# Patient Record
Sex: Male | Born: 1935 | Race: Asian | Hispanic: No | Smoking: Former smoker
Health system: Southern US, Community
[De-identification: ages and names within clinical notes are randomized; demographics above are authoritative.]

## PROBLEM LIST (undated history)

## (undated) DIAGNOSIS — I10 Essential (primary) hypertension: Secondary | ICD-10-CM

## (undated) DIAGNOSIS — K219 Gastro-esophageal reflux disease without esophagitis: Secondary | ICD-10-CM

---

## 2005-05-24 ENCOUNTER — Emergency Department: Payer: Self-pay | Admitting: Internal Medicine

## 2005-05-24 ENCOUNTER — Other Ambulatory Visit: Payer: Self-pay

## 2007-08-23 ENCOUNTER — Ambulatory Visit: Payer: Self-pay | Admitting: Family Medicine

## 2009-01-21 ENCOUNTER — Emergency Department: Payer: Self-pay | Admitting: Emergency Medicine

## 2012-01-15 ENCOUNTER — Emergency Department: Payer: Self-pay | Admitting: Emergency Medicine

## 2012-01-15 LAB — URINALYSIS, COMPLETE
Bacteria: NONE SEEN
Bilirubin,UR: NEGATIVE
Blood: NEGATIVE
Glucose,UR: NEGATIVE mg/dL (ref 0–75)
Ketone: NEGATIVE
Leukocyte Esterase: NEGATIVE
Ph: 7 (ref 4.5–8.0)
Protein: NEGATIVE
RBC,UR: 1 /HPF (ref 0–5)
Squamous Epithelial: <1

## 2012-01-15 LAB — COMPREHENSIVE METABOLIC PANEL
Anion Gap: 11 (ref 7–16)
BUN: 12 mg/dL (ref 7–18)
Bilirubin,Total: 0.5 mg/dL (ref 0.2–1.0)
Calcium, Total: 8.5 mg/dL (ref 8.5–10.1)
Chloride: 100 mmol/L (ref 98–107)
Co2: 23 mmol/L (ref 21–32)
Creatinine: 1.01 mg/dL (ref 0.60–1.30)
EGFR (African American): >60
EGFR (Non-African Amer.): >60
Osmolality: 269 (ref 275–301)
SGPT (ALT): 23 U/L (ref 12–78)
Sodium: 134 mmol/L — ABNORMAL LOW (ref 136–145)

## 2012-01-15 LAB — CBC
HCT: 41.2 % (ref 40.0–52.0)
HGB: 13.4 g/dL (ref 13.0–18.0)
MCV: 84 fL (ref 80–100)
Platelet: 170 10*3/uL (ref 150–440)
RBC: 4.91 10*6/uL (ref 4.40–5.90)
RDW: 13.1 % (ref 11.5–14.5)
WBC: 7.9 10*3/uL (ref 3.8–10.6)

## 2012-01-15 LAB — TROPONIN I: Troponin-I: <0.02 ng/mL

## 2012-01-15 LAB — CK TOTAL AND CKMB (NOT AT ARMC): CK-MB: 1.5 ng/mL (ref 0.5–3.6)

## 2012-02-11 ENCOUNTER — Encounter: Payer: Self-pay | Admitting: Unknown Physician Specialty

## 2012-02-26 ENCOUNTER — Encounter: Payer: Self-pay | Admitting: Unknown Physician Specialty

## 2015-10-05 ENCOUNTER — Encounter: Payer: Self-pay | Admitting: Emergency Medicine

## 2015-10-05 ENCOUNTER — Emergency Department: Payer: Medicare Other

## 2015-10-05 ENCOUNTER — Emergency Department
Admission: EM | Admit: 2015-10-05 | Discharge: 2015-10-05 | Disposition: A | Discharge status: Home / Self Care | Payer: Medicare Other | Attending: Student | Admitting: Student

## 2015-10-05 DIAGNOSIS — Z79899 Other long term (current) drug therapy: Secondary | ICD-10-CM | POA: Diagnosis not present

## 2015-10-05 DIAGNOSIS — Z87891 Personal history of nicotine dependence: Secondary | ICD-10-CM | POA: Insufficient documentation

## 2015-10-05 DIAGNOSIS — I10 Essential (primary) hypertension: Secondary | ICD-10-CM | POA: Diagnosis not present

## 2015-10-05 DIAGNOSIS — K529 Noninfective gastroenteritis and colitis, unspecified: Secondary | ICD-10-CM | POA: Insufficient documentation

## 2015-10-05 DIAGNOSIS — R1032 Left lower quadrant pain: Secondary | ICD-10-CM | POA: Diagnosis present

## 2015-10-05 HISTORY — DX: Gastro-esophageal reflux disease without esophagitis: K21.9

## 2015-10-05 LAB — COMPREHENSIVE METABOLIC PANEL
ALT: 19 U/L (ref 17–63)
AST: 25 U/L (ref 15–41)
Albumin: 3.8 g/dL (ref 3.5–5.0)
Alkaline Phosphatase: 63 U/L (ref 38–126)
Anion gap: 8 (ref 5–15)
BUN: 34 mg/dL — ABNORMAL HIGH (ref 6–20)
CHLORIDE: 100 mmol/L — AB (ref 101–111)
CO2: 22 mmol/L (ref 22–32)
CREATININE: 1.17 mg/dL (ref 0.61–1.24)
Calcium: 8.7 mg/dL — ABNORMAL LOW (ref 8.9–10.3)
GFR calc non Af Amer: 49 mL/min — ABNORMAL LOW (ref >60)
GFR, EST AFRICAN AMERICAN: 57 mL/min — AB (ref >60)
Glucose, Bld: 127 mg/dL — ABNORMAL HIGH (ref 65–99)
Potassium: 4.4 mmol/L (ref 3.5–5.1)
SODIUM: 130 mmol/L — AB (ref 135–145)
Total Bilirubin: 1.6 mg/dL — ABNORMAL HIGH (ref 0.3–1.2)
Total Protein: 6.6 g/dL (ref 6.5–8.1)

## 2015-10-05 LAB — URINALYSIS COMPLETE WITH MICROSCOPIC (ARMC ONLY)
BACTERIA UA: NONE SEEN
Bilirubin Urine: NEGATIVE
Glucose, UA: NEGATIVE mg/dL
Hgb urine dipstick: NEGATIVE
Ketones, ur: NEGATIVE mg/dL
Leukocytes, UA: NEGATIVE
Nitrite: NEGATIVE
PROTEIN: NEGATIVE mg/dL
SQUAMOUS EPITHELIAL / LPF: NONE SEEN
Specific Gravity, Urine: 1.012 (ref 1.005–1.030)
pH: 7 (ref 5.0–8.0)

## 2015-10-05 LAB — CBC
HEMATOCRIT: 39.4 % — AB (ref 40.0–52.0)
HEMOGLOBIN: 12.4 g/dL — AB (ref 13.0–18.0)
MCH: 25.8 pg — AB (ref 26.0–34.0)
MCHC: 31.5 g/dL — ABNORMAL LOW (ref 32.0–36.0)
MCV: 81.8 fL (ref 80.0–100.0)
Platelets: 146 10*3/uL — ABNORMAL LOW (ref 150–440)
RBC: 4.81 MIL/uL (ref 4.40–5.90)
RDW: 13.5 % (ref 11.5–14.5)
WBC: 16.4 10*3/uL — ABNORMAL HIGH (ref 3.8–10.6)

## 2015-10-05 LAB — TROPONIN I: TROPONIN I: 0.03 ng/mL (ref <0.031)

## 2015-10-05 LAB — LIPASE, BLOOD: LIPASE: 17 U/L (ref 11–51)

## 2015-10-05 MED ORDER — ONDANSETRON HCL 4 MG/2ML IJ SOLN
4.0000 mg | Freq: Once | INTRAMUSCULAR | Status: AC
  Administered 2015-10-05: 4 mg via INTRAVENOUS

## 2015-10-05 MED ORDER — ONDANSETRON HCL 4 MG/2ML IJ SOLN
4.0000 mg | Freq: Once | INTRAMUSCULAR | Status: AC
  Administered 2015-10-05: 4 mg via INTRAVENOUS
  Filled 2015-10-05: qty 2

## 2015-10-05 MED ORDER — DIATRIZOATE MEGLUMINE & SODIUM 66-10 % PO SOLN
15.0000 mL | Freq: Once | ORAL | Status: AC
  Administered 2015-10-05: 15 mL via ORAL

## 2015-10-05 MED ORDER — ACETAMINOPHEN 325 MG PO TABS: 650.0000 mg | ORAL_TABLET | Freq: Four times a day (QID) | ORAL | Status: AC | PRN

## 2015-10-05 MED ORDER — AMOXICILLIN-POT CLAVULANATE 875-125 MG PO TABS: 1.0000 | ORAL_TABLET | Freq: Two times a day (BID) | ORAL | Status: AC

## 2015-10-05 MED ORDER — MORPHINE SULFATE (PF) 2 MG/ML IV SOLN
2.0000 mg | Freq: Once | INTRAVENOUS | Status: AC
  Administered 2015-10-05: 2 mg via INTRAVENOUS
  Filled 2015-10-05: qty 1

## 2015-10-05 MED ORDER — ONDANSETRON HCL 4 MG/2ML IJ SOLN
INTRAMUSCULAR | Status: AC
  Administered 2015-10-05: 4 mg via INTRAVENOUS
  Filled 2015-10-05: qty 2

## 2015-10-05 MED ORDER — METRONIDAZOLE 500 MG PO TABS: 500.0000 mg | ORAL_TABLET | Freq: Three times a day (TID) | ORAL | Status: AC

## 2015-10-05 MED ORDER — SODIUM CHLORIDE 0.9 % IV BOLUS (SEPSIS)
500.0000 mL | Freq: Once | INTRAVENOUS | Status: AC
  Administered 2015-10-05: 500 mL via INTRAVENOUS

## 2015-10-05 MED ORDER — IOPAMIDOL (ISOVUE-300) INJECTION 61%
75.0000 mL | Freq: Once | INTRAVENOUS | Status: AC | PRN
  Administered 2015-10-05: 75 mL via INTRAVENOUS

## 2015-10-05 NOTE — ED Provider Notes (Signed)
[facility] Emergency Department Provider Note   ____________________________________________  Time seen: Approximately 11:07 AM  I have reviewed the triage vital signs and the nursing notes.   HISTORY  Chief Complaint Abdominal Pain  Caveat- patient is from [place] and there is a language barrier however his son is at bedside and able to translate. Patient was offered use of language line interpreter however he declined.  HPI Michael Shelton is a 82 y.o. male with history of hypertension and GERD who presents for evaluation of left lower quadrant pain which began yesterday, gradual onset, constant, no modifying factors, currently moderate. No nausea, vomiting, diarrhea, fevers or chills, no chest pain or difficulty breathing. He has not had this pain before. Abdominal surgical history is significant for prior hernia operation, he has also had hemorrhoid repair.   Past Medical History  Diagnosis Date  . GERD (gastroesophageal reflux disease)     There are no active problems to display for this patient.   History reviewed. No pertinent past surgical history.  Current Outpatient Rx  Name  Route  Sig  Dispense  Refill  . amLODipine (NORVASC) 5 MG tablet   Oral   Take 5 mg by mouth daily.         . famotidine (PEPCID) 40 MG tablet   Oral   Take 40 mg by mouth daily.         . montelukast (SINGULAIR) 10 MG tablet   Oral   Take 10 mg by mouth daily.         Marland Kitchen acetaminophen (TYLENOL) 325 MG tablet   Oral   Take 2 tablets (650 mg total) by mouth every 6 (six) hours as needed for moderate pain.   100 tablet   2   . amoxicillin-clavulanate (AUGMENTIN) 875-125 MG tablet   Oral   Take 1 tablet by mouth 2 (two) times daily.   14 tablet   0   . metroNIDAZOLE (FLAGYL) 500 MG tablet   Oral   Take 1 tablet (500 mg total) by mouth 3 (three) times daily.   21 tablet   0     Allergies Review of patient's allergies indicates no known  allergies.  History reviewed. No pertinent family history.  Social History Social History  Substance Use Topics  . Smoking status: Former Games developer  . Smokeless tobacco: None  . Alcohol Use: No    Review of Systems Constitutional: No fever/chills Eyes: No visual changes. ENT: No sore throat. Cardiovascular: Denies chest pain. Respiratory: Denies shortness of breath. Gastrointestinal: + abdominal pain.  No nausea, no vomiting.  No diarrhea.  No constipation. Genitourinary: Negative for dysuria. Musculoskeletal: Negative for back pain. Skin: Negative for rash. Neurological: Negative for headaches, focal weakness or numbness.  10-point ROS otherwise negative.  ____________________________________________   PHYSICAL EXAM:  VITAL SIGNS: ED Triage Vitals  Enc Vitals Group     BP 10/05/15 1003 116/61 mmHg     Pulse Rate 10/05/15 1003 86     Resp 10/05/15 1003 20     Temp 10/05/15 1003 98 F (36.7 C)     Temp Source 10/05/15 1003 Oral     SpO2 10/05/15 1003 99 %     Weight 10/05/15 1003 107 lb (48.535 kg)     Height 10/05/15 1003 5' (1.524 m)     Head Cir --      Peak Flow --      Pain Score 10/05/15 1007 9     Pain Loc --  Pain Edu? --      Excl. in GC? --     Constitutional: Alert and oriented. Well appearing and in no acute distress. Eyes: Conjunctivae are normal. PERRL. EOMI. Head: Atraumatic. Nose: No congestion/rhinnorhea. Mouth/Throat: Mucous membranes are moist.  Oropharynx non-erythematous. Neck: No stridor.  Supple without meningismus. Cardiovascular: Normal rate, regular rhythm. Grossly normal heart sounds.  Good peripheral circulation. Respiratory: Normal respiratory effort.  No retractions. Lungs CTAB. Gastrointestinal: Soft  with diffuse abdominal tenderness, mild distention, tenderness is worse in the left lower quadrant. No CVA tenderness. Genitourinary: Deferred Musculoskeletal: Trace nonpitting edema bilateral lower extremities which is chronic  according to his son.  No joint effusions. Neurologic:  Normal speech and language. No gross focal neurologic deficits are appreciated. Skin:  Skin is warm, dry and intact. No rash noted. Psychiatric: Mood and affect are normal. Speech and behavior are normal.  ____________________________________________   LABS (all labs ordered are listed, but only abnormal results are displayed)  Labs Reviewed  COMPREHENSIVE METABOLIC PANEL - Abnormal; Notable for the following:    Sodium 130 (*)    Chloride 100 (*)    Glucose, Bld 127 (*)    BUN 34 (*)    Calcium 8.7 (*)    Total Bilirubin 1.6 (*)    GFR calc non Af Amer 49 (*)    GFR calc Af Amer 57 (*)    All other components within normal limits  CBC - Abnormal; Notable for the following:    WBC 16.4 (*)    Hemoglobin 12.4 (*)    HCT 39.4 (*)    MCH 25.8 (*)    MCHC 31.5 (*)    Platelets 146 (*)    All other components within normal limits  URINALYSIS COMPLETEWITH MICROSCOPIC ([facility] ONLY) - Abnormal; Notable for the following:    Color, Urine YELLOW (*)    APPearance CLEAR (*)    All other components within normal limits  LIPASE, BLOOD  TROPONIN I   ____________________________________________  EKG  ED ECG REPORT I, Gayla Doss, the attending physician, personally viewed and interpreted this ECG.   Date: 10/05/2015  EKG Time:11:20  Rate: 95  Rhythm: normal sinus rhythm with PACs  Axis: none  Intervals:none  ST&T Change: No acute ST elevation,  no acute ST depression.  ____________________________________________  RADIOLOGY  CT abdomen and pelvis IMPRESSION: 1. Mid small bowel wall thickening and edema, consistent with enteritis. Etiologies include ischemic bowel or infection. Although there is advanced atherosclerosis, no acute mesenteric thrombosis is seen. 2. Small volume abdominal pelvic fluid, likely secondary. 3. Hiatal hernia. 4. Enlargement of a dominant central left hepatic lobe cyst.  Minimal intrahepatic ductal dilatation is likely secondary. ____________________________________________   PROCEDURES  Procedure(s) performed: None  Critical Care performed: No  ____________________________________________   INITIAL IMPRESSION / ASSESSMENT AND PLAN / ED COURSE  Pertinent labs & imaging results that were available during my care of the patient were reviewed by me and considered in my medical decision making (see chart for details).  Michael Shelton is a 82 y.o. male with history of hypertension and GERD who presents for evaluation of left lower quadrant pain which began yesterday. On exam, he is generally well-appearing and in no acute distress. His vital signs are stable and he is afebrile. He does have significant tenderness to palpation throughout the abdomen, worse in the left lower quadrant. CBC with leukocytosis, mild anemia. CMP with moderate hyponatremia, sodium of 130, we'll give IV fluids. T bili is mildly  elevated at 1.6 however the remainder of his LFTs are within normal limits. We'll treat his pain, obtain CT of the abdomen and pelvis, screening EKG, reassess for disposition.  ----------------------------------------- 2:45 PM on 10/05/2015 -----------------------------------------  The patient had several episodes of vomiting in the emergency department after drinking oral contrast however he now reports that he feels much better and he is tolerating by mouth intake. CT scan shows likely enteritis, likely infectious, less likely to be ischemic given that he has normal bicarbonate, no mesenteric thrombosis on his CT scan. I discussed that I'm concerned about his pain and given his white blood cell count elevation and advanced age, I recommended he stay in the hospital this evening for IV hydration, antibiotics, close observation. The patient adamantly refused admission stating that he feels better, his abdominal pain has resolved and he wants to go home. Discussed that  if this represented a bacterial infection, though he is not currently exhibiting signs of sepsis, he could develop a severe infection which could kill him. He and his family voice understanding of this but he reports he feels well  and wants to go home. His troponin is negative. His urinalysis is not consistent with infection. His vital signs are stable and he is not currently meeting criteria for sepsis. We discussed meticulous return precautions, need for close follow-up and they are comfortable with the discharge plan. ____________________________________________   FINAL CLINICAL IMPRESSION(S) / ED DIAGNOSES  Final diagnoses:  Left lower quadrant pain  Enteritis      NEW MEDICATIONS STARTED DURING THIS VISIT:  Discharge Medication List as of 10/05/2015  2:53 PM    START taking these medications   Details  acetaminophen (TYLENOL) 325 MG tablet Take 2 tablets (650 mg total) by mouth every 6 (six) hours as needed for moderate pain., Starting 10/05/2015, Until Sun 10/04/16, Print    amoxicillin-clavulanate (AUGMENTIN) 875-125 MG tablet Take 1 tablet by mouth 2 (two) times daily., Starting 10/05/2015, Until Sat 10/12/15, Print    metroNIDAZOLE (FLAGYL) 500 MG tablet Take 1 tablet (500 mg total) by mouth 3 (three) times daily., Starting 10/05/2015, Until Sat 10/12/15, Print         Note:  This document was prepared using Dragon voice recognition software and may include unintentional dictation errors.    Gayla DossEryka A Takoda Siedlecki, MD 10/05/15 2105

## 2015-10-05 NOTE — ED Notes (Signed)
Pt. Going home with family. 

## 2015-10-05 NOTE — Discharge Instructions (Signed)
You were seen in the emergency department for abdominal pain likely caused by an infection" which creates enteritis, inflammation of your small bowel. Take all medicines as prescribed. Return immediately to the emergency department for severe, worsening or persistent abdominal pain, vomiting, diarrhea, blood in vomit or stools, fevers, chest pain, difficulty breathing, inability to urinate or have a bowel movement or for any other concerns.

## 2015-10-05 NOTE — ED Notes (Signed)
LLQ abd pain that stared yesterday. Tender to touch. Denies Vomiting Diarrhea or Fever

## 2016-07-28 ENCOUNTER — Ambulatory Visit
Admission: RE | Admit: 2016-07-28 | Discharge: 2016-07-28 | Disposition: A | Discharge status: Home / Self Care | Payer: Medicare Other | Source: Ambulatory Visit | Attending: Family Medicine | Admitting: Family Medicine

## 2016-07-28 ENCOUNTER — Other Ambulatory Visit: Payer: Self-pay | Admitting: Family Medicine

## 2016-07-28 DIAGNOSIS — R600 Localized edema: Secondary | ICD-10-CM | POA: Insufficient documentation

## 2017-03-25 ENCOUNTER — Inpatient Hospital Stay: Payer: Medicare Other

## 2017-03-25 ENCOUNTER — Emergency Department: Payer: Medicare Other

## 2017-03-25 ENCOUNTER — Inpatient Hospital Stay: Payer: Medicare Other | Admitting: Anesthesiology

## 2017-03-25 ENCOUNTER — Other Ambulatory Visit: Payer: Self-pay

## 2017-03-25 ENCOUNTER — Encounter
Admission: EM | Disposition: A | Discharge status: Home Health | Payer: Self-pay | Source: Home / Self Care | Attending: Internal Medicine

## 2017-03-25 ENCOUNTER — Inpatient Hospital Stay
Admission: EM | Admit: 2017-03-25 | Discharge: 2017-03-29 | DRG: 481 | Disposition: A | Discharge status: Home Health | Payer: Medicare Other | Attending: Internal Medicine | Admitting: Internal Medicine

## 2017-03-25 ENCOUNTER — Encounter: Payer: Self-pay | Admitting: Emergency Medicine

## 2017-03-25 DIAGNOSIS — D631 Anemia in chronic kidney disease: Secondary | ICD-10-CM | POA: Diagnosis present

## 2017-03-25 DIAGNOSIS — S72001A Fracture of unspecified part of neck of right femur, initial encounter for closed fracture: Secondary | ICD-10-CM | POA: Diagnosis present

## 2017-03-25 DIAGNOSIS — N183 Chronic kidney disease, stage 3 (moderate): Secondary | ICD-10-CM | POA: Diagnosis present

## 2017-03-25 DIAGNOSIS — I959 Hypotension, unspecified: Secondary | ICD-10-CM | POA: Diagnosis present

## 2017-03-25 DIAGNOSIS — I129 Hypertensive chronic kidney disease with stage 1 through stage 4 chronic kidney disease, or unspecified chronic kidney disease: Secondary | ICD-10-CM | POA: Diagnosis present

## 2017-03-25 DIAGNOSIS — D696 Thrombocytopenia, unspecified: Secondary | ICD-10-CM | POA: Diagnosis present

## 2017-03-25 DIAGNOSIS — S72141A Displaced intertrochanteric fracture of right femur, initial encounter for closed fracture: Principal | ICD-10-CM | POA: Diagnosis present

## 2017-03-25 DIAGNOSIS — R296 Repeated falls: Secondary | ICD-10-CM | POA: Diagnosis present

## 2017-03-25 DIAGNOSIS — M25551 Pain in right hip: Secondary | ICD-10-CM | POA: Diagnosis present

## 2017-03-25 DIAGNOSIS — W010XXA Fall on same level from slipping, tripping and stumbling without subsequent striking against object, initial encounter: Secondary | ICD-10-CM | POA: Diagnosis present

## 2017-03-25 DIAGNOSIS — H9190 Unspecified hearing loss, unspecified ear: Secondary | ICD-10-CM | POA: Diagnosis present

## 2017-03-25 DIAGNOSIS — N179 Acute kidney failure, unspecified: Secondary | ICD-10-CM | POA: Diagnosis present

## 2017-03-25 DIAGNOSIS — E871 Hypo-osmolality and hyponatremia: Secondary | ICD-10-CM | POA: Diagnosis present

## 2017-03-25 DIAGNOSIS — D62 Acute posthemorrhagic anemia: Secondary | ICD-10-CM | POA: Diagnosis not present

## 2017-03-25 DIAGNOSIS — K219 Gastro-esophageal reflux disease without esophagitis: Secondary | ICD-10-CM | POA: Diagnosis present

## 2017-03-25 DIAGNOSIS — M791 Myalgia, unspecified site: Secondary | ICD-10-CM | POA: Diagnosis present

## 2017-03-25 DIAGNOSIS — Z87891 Personal history of nicotine dependence: Secondary | ICD-10-CM

## 2017-03-25 DIAGNOSIS — J302 Other seasonal allergic rhinitis: Secondary | ICD-10-CM | POA: Diagnosis present

## 2017-03-25 DIAGNOSIS — Z419 Encounter for procedure for purposes other than remedying health state, unspecified: Secondary | ICD-10-CM

## 2017-03-25 HISTORY — PX: INTRAMEDULLARY (IM) NAIL INTERTROCHANTERIC: SHX5875

## 2017-03-25 HISTORY — DX: Essential (primary) hypertension: I10

## 2017-03-25 LAB — CBC WITH DIFFERENTIAL/PLATELET
BASOS ABS: 0 10*3/uL (ref 0–0.1)
BASOS PCT: 0 %
EOS ABS: 0.1 10*3/uL (ref 0–0.7)
EOS PCT: 1 %
HCT: 39.5 % — ABNORMAL LOW (ref 40.0–52.0)
Hemoglobin: 12.6 g/dL — ABNORMAL LOW (ref 13.0–18.0)
Lymphocytes Relative: 5 %
Lymphs Abs: 0.4 10*3/uL — ABNORMAL LOW (ref 1.0–3.6)
MCH: 26.5 pg (ref 26.0–34.0)
MCHC: 31.8 g/dL — ABNORMAL LOW (ref 32.0–36.0)
MCV: 83.3 fL (ref 80.0–100.0)
MONO ABS: 0.7 10*3/uL (ref 0.2–1.0)
Monocytes Relative: 9 %
Neutro Abs: 6.8 10*3/uL — ABNORMAL HIGH (ref 1.4–6.5)
Neutrophils Relative %: 85 %
PLATELETS: 113 10*3/uL — AB (ref 150–440)
RBC: 4.75 MIL/uL (ref 4.40–5.90)
RDW: 14.3 % (ref 11.5–14.5)
WBC: 8 10*3/uL (ref 3.8–10.6)

## 2017-03-25 LAB — APTT: APTT: 35 s (ref 24–36)

## 2017-03-25 LAB — COMPREHENSIVE METABOLIC PANEL
ALBUMIN: 4 g/dL (ref 3.5–5.0)
ALK PHOS: 62 U/L (ref 38–126)
ALT: 28 U/L (ref 17–63)
ANION GAP: 12 (ref 5–15)
AST: 38 U/L (ref 15–41)
BILIRUBIN TOTAL: 1.6 mg/dL — AB (ref 0.3–1.2)
BUN: 38 mg/dL — AB (ref 6–20)
CALCIUM: 9.1 mg/dL (ref 8.9–10.3)
CO2: 26 mmol/L (ref 22–32)
Chloride: 89 mmol/L — ABNORMAL LOW (ref 101–111)
Creatinine, Ser: 2.17 mg/dL — ABNORMAL HIGH (ref 0.61–1.24)
GFR calc Af Amer: 27 mL/min — ABNORMAL LOW (ref >60)
GFR, EST NON AFRICAN AMERICAN: 23 mL/min — AB (ref >60)
GLUCOSE: 145 mg/dL — AB (ref 65–99)
POTASSIUM: 4.2 mmol/L (ref 3.5–5.1)
Sodium: 127 mmol/L — ABNORMAL LOW (ref 135–145)
TOTAL PROTEIN: 6.9 g/dL (ref 6.5–8.1)

## 2017-03-25 LAB — PROTIME-INR
INR: 1.17
PROTHROMBIN TIME: 14.8 s (ref 11.4–15.2)

## 2017-03-25 SURGERY — FIXATION, FRACTURE, INTERTROCHANTERIC, WITH INTRAMEDULLARY ROD
Anesthesia: General | Site: Hip | Laterality: Right | Wound class: Clean

## 2017-03-25 MED ORDER — DOCUSATE SODIUM 100 MG PO CAPS
100.0000 mg | ORAL_CAPSULE | Freq: Two times a day (BID) | ORAL | Status: DC
  Administered 2017-03-25 – 2017-03-29 (×6): 100 mg via ORAL
  Filled 2017-03-25 (×6): qty 1

## 2017-03-25 MED ORDER — ROCURONIUM BROMIDE 50 MG/5ML IV SOLN
INTRAVENOUS | Status: AC
  Filled 2017-03-25: qty 1

## 2017-03-25 MED ORDER — BISACODYL 5 MG PO TBEC: 5.0000 mg | DELAYED_RELEASE_TABLET | Freq: Every day | ORAL | Status: DC | PRN

## 2017-03-25 MED ORDER — DEXAMETHASONE SODIUM PHOSPHATE 10 MG/ML IJ SOLN
INTRAMUSCULAR | Status: AC
  Filled 2017-03-25: qty 1

## 2017-03-25 MED ORDER — ONDANSETRON HCL 4 MG/2ML IJ SOLN: 4.0000 mg | Freq: Four times a day (QID) | INTRAMUSCULAR | Status: DC | PRN

## 2017-03-25 MED ORDER — CEFAZOLIN SODIUM-DEXTROSE 2-4 GM/100ML-% IV SOLN: 2.0000 g | Freq: Four times a day (QID) | INTRAVENOUS | Status: DC

## 2017-03-25 MED ORDER — FLEET ENEMA 7-19 GM/118ML RE ENEM: 1.0000 | ENEMA | Freq: Once | RECTAL | Status: DC | PRN

## 2017-03-25 MED ORDER — DIPHENHYDRAMINE HCL 12.5 MG/5ML PO ELIX: 12.5000 mg | ORAL_SOLUTION | ORAL | Status: DC | PRN

## 2017-03-25 MED ORDER — NEOMYCIN-POLYMYXIN B GU 40-200000 IR SOLN
Status: AC
  Filled 2017-03-25: qty 2

## 2017-03-25 MED ORDER — FENTANYL CITRATE (PF) 100 MCG/2ML IJ SOLN
INTRAMUSCULAR | Status: AC
  Filled 2017-03-25: qty 2

## 2017-03-25 MED ORDER — SUGAMMADEX SODIUM 200 MG/2ML IV SOLN
INTRAVENOUS | Status: DC | PRN
Start: 2017-03-25 — End: 2017-03-25
  Administered 2017-03-25: 90 mg via INTRAVENOUS

## 2017-03-25 MED ORDER — HEPARIN SODIUM (PORCINE) 5000 UNIT/ML IJ SOLN
5000.0000 [IU] | Freq: Three times a day (TID) | INTRAMUSCULAR | Status: DC
  Administered 2017-03-25 – 2017-03-27 (×5): 5000 [IU] via SUBCUTANEOUS
  Filled 2017-03-25 (×5): qty 1

## 2017-03-25 MED ORDER — ONDANSETRON HCL 4 MG/2ML IJ SOLN
4.0000 mg | Freq: Once | INTRAMUSCULAR | Status: AC
  Administered 2017-03-25: 4 mg via INTRAVENOUS
  Filled 2017-03-25: qty 2

## 2017-03-25 MED ORDER — VITAMIN D (ERGOCALCIFEROL) 1.25 MG (50000 UNIT) PO CAPS
50000.0000 [IU] | ORAL_CAPSULE | ORAL | Status: DC
  Administered 2017-03-26: 50000 [IU] via ORAL
  Filled 2017-03-25: qty 1

## 2017-03-25 MED ORDER — OXYCODONE HCL 5 MG PO TABS: 10.0000 mg | ORAL_TABLET | ORAL | Status: DC | PRN

## 2017-03-25 MED ORDER — METOCLOPRAMIDE HCL 5 MG/ML IJ SOLN: 5.0000 mg | Freq: Three times a day (TID) | INTRAMUSCULAR | Status: DC | PRN

## 2017-03-25 MED ORDER — ALBUMIN HUMAN 5 % IV SOLN
INTRAVENOUS | Status: AC
  Administered 2017-03-25: 12.5 g via INTRAVENOUS
  Filled 2017-03-25: qty 250

## 2017-03-25 MED ORDER — ONDANSETRON HCL 4 MG PO TABS: 4.0000 mg | ORAL_TABLET | Freq: Four times a day (QID) | ORAL | Status: DC | PRN

## 2017-03-25 MED ORDER — SUCCINYLCHOLINE CHLORIDE 20 MG/ML IJ SOLN
INTRAMUSCULAR | Status: DC | PRN
  Administered 2017-03-25: 100 mg via INTRAVENOUS

## 2017-03-25 MED ORDER — OXYCODONE HCL 5 MG PO TABS
5.0000 mg | ORAL_TABLET | ORAL | Status: DC | PRN
  Filled 2017-03-25: qty 1

## 2017-03-25 MED ORDER — EPHEDRINE SULFATE 50 MG/ML IJ SOLN
INTRAMUSCULAR | Status: DC | PRN
  Administered 2017-03-25 (×2): 10 mg via INTRAVENOUS

## 2017-03-25 MED ORDER — ONDANSETRON HCL 4 MG/2ML IJ SOLN: 4.0000 mg | Freq: Once | INTRAMUSCULAR | Status: DC | PRN

## 2017-03-25 MED ORDER — PROPOFOL 10 MG/ML IV BOLUS
INTRAVENOUS | Status: AC
  Filled 2017-03-25: qty 20

## 2017-03-25 MED ORDER — HYDROCODONE-ACETAMINOPHEN 5-325 MG PO TABS: 1.0000 | ORAL_TABLET | ORAL | Status: DC | PRN

## 2017-03-25 MED ORDER — ONDANSETRON HCL 4 MG/2ML IJ SOLN
INTRAMUSCULAR | Status: DC | PRN
Start: 2017-03-25 — End: 2017-03-25
  Administered 2017-03-25: 4 mg via INTRAVENOUS

## 2017-03-25 MED ORDER — ALBUMIN HUMAN 5 % IV SOLN
12.5000 g | Freq: Once | INTRAVENOUS | Status: AC
  Administered 2017-03-25: 12.5 g via INTRAVENOUS

## 2017-03-25 MED ORDER — ACETAMINOPHEN 325 MG PO TABS
650.0000 mg | ORAL_TABLET | ORAL | Status: DC | PRN
  Administered 2017-03-28 – 2017-03-29 (×4): 650 mg via ORAL
  Filled 2017-03-25 (×4): qty 2

## 2017-03-25 MED ORDER — ONDANSETRON HCL 4 MG/2ML IJ SOLN
INTRAMUSCULAR | Status: AC
  Filled 2017-03-25: qty 2

## 2017-03-25 MED ORDER — NEOMYCIN-POLYMYXIN B GU 40-200000 IR SOLN
Status: DC | PRN
  Administered 2017-03-25: 2 mL

## 2017-03-25 MED ORDER — SODIUM CHLORIDE 0.9 % IV SOLN
INTRAVENOUS | Status: DC
  Administered 2017-03-25: 16:00:00 via INTRAVENOUS

## 2017-03-25 MED ORDER — BISACODYL 10 MG RE SUPP
10.0000 mg | Freq: Every day | RECTAL | Status: DC | PRN
  Administered 2017-03-27: 10 mg via RECTAL
  Filled 2017-03-25: qty 1

## 2017-03-25 MED ORDER — SODIUM CHLORIDE 0.9 % IV SOLN
1000.0000 mL | Freq: Once | INTRAVENOUS | Status: AC
Start: 2017-03-25 — End: 2017-03-25
  Administered 2017-03-25: 1000 mL via INTRAVENOUS

## 2017-03-25 MED ORDER — CEFAZOLIN SODIUM-DEXTROSE 2-4 GM/100ML-% IV SOLN
2.0000 g | INTRAVENOUS | Status: DC
  Filled 2017-03-25: qty 100

## 2017-03-25 MED ORDER — ACETAMINOPHEN 500 MG PO TABS
1000.0000 mg | ORAL_TABLET | Freq: Four times a day (QID) | ORAL | Status: AC
  Administered 2017-03-25 – 2017-03-26 (×4): 1000 mg via ORAL
  Filled 2017-03-25 (×5): qty 2

## 2017-03-25 MED ORDER — FENTANYL CITRATE (PF) 100 MCG/2ML IJ SOLN
INTRAMUSCULAR | Status: DC | PRN
  Administered 2017-03-25: 25 ug via INTRAVENOUS
  Administered 2017-03-25: 50 ug via INTRAVENOUS

## 2017-03-25 MED ORDER — ROCURONIUM BROMIDE 100 MG/10ML IV SOLN
INTRAVENOUS | Status: DC | PRN
  Administered 2017-03-25: 15 mg via INTRAVENOUS
  Administered 2017-03-25: 5 mg via INTRAVENOUS

## 2017-03-25 MED ORDER — HYDROMORPHONE HCL 1 MG/ML IJ SOLN: 0.5000 mg | INTRAMUSCULAR | Status: DC | PRN

## 2017-03-25 MED ORDER — FENTANYL CITRATE (PF) 100 MCG/2ML IJ SOLN
25.0000 ug | INTRAMUSCULAR | Status: AC | PRN
  Administered 2017-03-25 (×2): 25 ug via INTRAVENOUS
  Filled 2017-03-25 (×2): qty 2

## 2017-03-25 MED ORDER — ACETAMINOPHEN 10 MG/ML IV SOLN
INTRAVENOUS | Status: DC | PRN
  Administered 2017-03-25: 500 mg via INTRAVENOUS

## 2017-03-25 MED ORDER — ENOXAPARIN SODIUM 30 MG/0.3ML ~~LOC~~ SOLN: 30.0000 mg | SUBCUTANEOUS | Status: DC

## 2017-03-25 MED ORDER — METOCLOPRAMIDE HCL 10 MG PO TABS: 5.0000 mg | ORAL_TABLET | Freq: Three times a day (TID) | ORAL | Status: DC | PRN

## 2017-03-25 MED ORDER — CEFAZOLIN SODIUM-DEXTROSE 2-4 GM/100ML-% IV SOLN
INTRAVENOUS | Status: AC
  Filled 2017-03-25: qty 100

## 2017-03-25 MED ORDER — HEPARIN SODIUM (PORCINE) 5000 UNIT/ML IJ SOLN
5000.0000 [IU] | Freq: Three times a day (TID) | INTRAMUSCULAR | Status: DC
  Filled 2017-03-25: qty 1

## 2017-03-25 MED ORDER — LACTATED RINGERS IV SOLN: INTRAVENOUS | Status: DC

## 2017-03-25 MED ORDER — MAGNESIUM HYDROXIDE 400 MG/5ML PO SUSP: 30.0000 mL | Freq: Every day | ORAL | Status: DC | PRN

## 2017-03-25 MED ORDER — PHENYLEPHRINE HCL 10 MG/ML IJ SOLN
INTRAMUSCULAR | Status: DC | PRN
  Administered 2017-03-25 (×2): 100 ug via INTRAVENOUS

## 2017-03-25 MED ORDER — ENOXAPARIN SODIUM 40 MG/0.4ML ~~LOC~~ SOLN: 40.0000 mg | SUBCUTANEOUS | Status: DC

## 2017-03-25 MED ORDER — SUCCINYLCHOLINE CHLORIDE 20 MG/ML IJ SOLN
INTRAMUSCULAR | Status: AC
  Filled 2017-03-25: qty 1

## 2017-03-25 MED ORDER — ACETAMINOPHEN 10 MG/ML IV SOLN
INTRAVENOUS | Status: AC
  Filled 2017-03-25: qty 100

## 2017-03-25 MED ORDER — PROPOFOL 10 MG/ML IV BOLUS
INTRAVENOUS | Status: DC | PRN
  Administered 2017-03-25: 50 mg via INTRAVENOUS
  Administered 2017-03-25: 30 mg via INTRAVENOUS
  Administered 2017-03-25: 20 mg via INTRAVENOUS

## 2017-03-25 MED ORDER — ACETAMINOPHEN 650 MG RE SUPP: 650.0000 mg | Freq: Four times a day (QID) | RECTAL | Status: DC | PRN

## 2017-03-25 MED ORDER — ACETAMINOPHEN 325 MG PO TABS
650.0000 mg | ORAL_TABLET | Freq: Four times a day (QID) | ORAL | Status: DC | PRN
Start: 2017-03-25 — End: 2017-03-25

## 2017-03-25 MED ORDER — BUPIVACAINE-EPINEPHRINE (PF) 0.5% -1:200000 IJ SOLN
INTRAMUSCULAR | Status: DC | PRN
  Administered 2017-03-25: 30 mL

## 2017-03-25 MED ORDER — ACETAMINOPHEN 650 MG RE SUPP: 650.0000 mg | RECTAL | Status: DC | PRN

## 2017-03-25 MED ORDER — FENTANYL CITRATE (PF) 100 MCG/2ML IJ SOLN: 25.0000 ug | INTRAMUSCULAR | Status: DC | PRN

## 2017-03-25 MED ORDER — DEXTROSE 5 % IV SOLN
2.0000 g | Freq: Four times a day (QID) | INTRAVENOUS | Status: AC
  Administered 2017-03-25 – 2017-03-26 (×3): 2 g via INTRAVENOUS
  Filled 2017-03-25 (×3): qty 2000

## 2017-03-25 MED ORDER — LIDOCAINE HCL (CARDIAC) 20 MG/ML IV SOLN
INTRAVENOUS | Status: DC | PRN
  Administered 2017-03-25: 60 mg via INTRAVENOUS

## 2017-03-25 MED ORDER — KCL IN DEXTROSE-NACL 20-5-0.45 MEQ/L-%-% IV SOLN
INTRAVENOUS | Status: DC
  Administered 2017-03-25 – 2017-03-26 (×2): via INTRAVENOUS
  Filled 2017-03-25 (×3): qty 1000

## 2017-03-25 MED ORDER — HYDROMORPHONE HCL 1 MG/ML IJ SOLN: 1.0000 mg | INTRAMUSCULAR | Status: DC | PRN

## 2017-03-25 MED ORDER — DEXAMETHASONE SODIUM PHOSPHATE 10 MG/ML IJ SOLN
INTRAMUSCULAR | Status: DC | PRN
  Administered 2017-03-25: 5 mg via INTRAVENOUS

## 2017-03-25 MED ORDER — BUPIVACAINE-EPINEPHRINE (PF) 0.5% -1:200000 IJ SOLN
INTRAMUSCULAR | Status: AC
  Filled 2017-03-25: qty 30

## 2017-03-25 MED ORDER — CEFAZOLIN SODIUM-DEXTROSE 2-3 GM-%(50ML) IV SOLR
INTRAVENOUS | Status: DC | PRN
  Administered 2017-03-25: 1 g via INTRAVENOUS

## 2017-03-25 MED ORDER — MONTELUKAST SODIUM 10 MG PO TABS
10.0000 mg | ORAL_TABLET | Freq: Every day | ORAL | Status: DC
  Administered 2017-03-25 – 2017-03-29 (×4): 10 mg via ORAL
  Filled 2017-03-25 (×4): qty 1

## 2017-03-25 SURGICAL SUPPLY — 44 items
BIT DRILL CANN LG 4.3MM (BIT) ×1 IMPLANT
BNDG COHESIVE 4X5 TAN STRL (GAUZE/BANDAGES/DRESSINGS) ×3 IMPLANT
BNDG COHESIVE 6X5 TAN STRL LF (GAUZE/BANDAGES/DRESSINGS) ×3 IMPLANT
CANISTER SUCT 1200ML W/VALVE (MISCELLANEOUS) ×3 IMPLANT
CHLORAPREP W/TINT 26ML (MISCELLANEOUS) ×3 IMPLANT
DRAPE C-ARMOR (DRAPES) ×3 IMPLANT
DRAPE SHEET LG 3/4 BI-LAMINATE (DRAPES) ×3 IMPLANT
DRILL BIT CANN LG 4.3MM (BIT) ×3
DRSG OPSITE POSTOP 3X4 (GAUZE/BANDAGES/DRESSINGS) ×6 IMPLANT
DRSG OPSITE POSTOP 4X6 (GAUZE/BANDAGES/DRESSINGS) ×3 IMPLANT
DRSG TEGADERM 4X4.75 (GAUZE/BANDAGES/DRESSINGS) ×3 IMPLANT
ELECT CAUTERY BLADE 6.4 (BLADE) ×3 IMPLANT
ELECT REM PT RETURN 9FT ADLT (ELECTROSURGICAL) ×3
ELECTRODE REM PT RTRN 9FT ADLT (ELECTROSURGICAL) ×1 IMPLANT
GAUZE SPONGE 4X4 12PLY STRL (GAUZE/BANDAGES/DRESSINGS) IMPLANT
GLOVE BIO SURGEON STRL SZ8 (GLOVE) ×6 IMPLANT
GLOVE BIOGEL PI IND STRL 7.5 (GLOVE) ×6 IMPLANT
GLOVE BIOGEL PI INDICATOR 7.5 (GLOVE) ×12
GLOVE INDICATOR 8.0 STRL GRN (GLOVE) ×3 IMPLANT
GOWN STRL REUS W/ TWL LRG LVL3 (GOWN DISPOSABLE) ×3 IMPLANT
GOWN STRL REUS W/ TWL XL LVL3 (GOWN DISPOSABLE) ×1 IMPLANT
GOWN STRL REUS W/TWL LRG LVL3 (GOWN DISPOSABLE) ×6
GOWN STRL REUS W/TWL XL LVL3 (GOWN DISPOSABLE) ×2
GUIDEPIN VERSANAIL DSP 3.2X444 (ORTHOPEDIC DISPOSABLE SUPPLIES) ×3 IMPLANT
GUIDEWIRE BALL NOSE 80CM (WIRE) ×3 IMPLANT
HIP FRA NAIL LAG SCREW 10.5X90 (Orthopedic Implant) ×3 IMPLANT
MAT BLUE FLOOR 46X72 FLO (MISCELLANEOUS) ×3 IMPLANT
NAIL HIP FRACT 130D 9X180 (Orthopedic Implant) ×3 IMPLANT
NEEDLE FILTER BLUNT 18X 1/2SAF (NEEDLE) ×2
NEEDLE FILTER BLUNT 18X1 1/2 (NEEDLE) ×1 IMPLANT
NEEDLE HYPO 22GX1.5 SAFETY (NEEDLE) ×3 IMPLANT
NS IRRIG 500ML POUR BTL (IV SOLUTION) ×3 IMPLANT
PACK HIP COMPR (MISCELLANEOUS) ×3 IMPLANT
SCREW BONE CORTICAL 5.0X30 (Screw) ×3 IMPLANT
SCREW LAG HIP FRA NAIL 10.5X90 (Orthopedic Implant) ×1 IMPLANT
STAPLER SKIN PROX 35W (STAPLE) ×3 IMPLANT
STRAP SAFETY BODY (MISCELLANEOUS) ×3 IMPLANT
SUT VIC AB 0 CT1 36 (SUTURE) ×3 IMPLANT
SUT VIC AB 1 CT1 36 (SUTURE) ×3 IMPLANT
SUT VIC AB 2-0 CT1 (SUTURE) ×3 IMPLANT
SYR 10ML LL (SYRINGE) ×3 IMPLANT
SYR 30ML LL (SYRINGE) ×3 IMPLANT
SYR 3ML 18GX1 1/2 (SYRINGE) ×3 IMPLANT
TAPE MICROFOAM 4IN (TAPE) IMPLANT

## 2017-03-25 NOTE — Anesthesia Preprocedure Evaluation (Signed)
Anesthesia Evaluation  Patient identified by MRN, date of birth, ID bandGeneral Assessment Comment:Pt sedated, hard of hearing and does not understand [place]. Spoke with son who answered the ROS questions.  Reviewed: Allergy & Precautions, NPO status , Patient's Chart, lab work & pertinent test results  History of Anesthesia Complications Negative for: history of anesthetic complications  Airway Mallampati: III       Dental   Pulmonary neg sleep apnea, neg COPD, former smoker,           Cardiovascular hypertension, Pt. on medications (-) Past MI and (-) CHF (-) dysrhythmias (-) Valvular Problems/Murmurs     Neuro/Psych neg Seizures    GI/Hepatic Neg liver ROS, GERD  Medicated and Controlled,  Endo/Other  neg diabetes  Renal/GU negative Renal ROS     Musculoskeletal   Abdominal   Peds  Hematology   Anesthesia Other Findings   Reproductive/Obstetrics                             Anesthesia Physical Anesthesia Plan  ASA: III  Anesthesia Plan: General   Post-op Pain Management:    Induction: Intravenous  PONV Risk Score and Plan: 2  Airway Management Planned: Oral ETT  Additional Equipment:   Intra-op Plan:   Post-operative Plan:   Informed Consent: I have reviewed the patients History and Physical, chart, labs and discussed the procedure including the risks, benefits and alternatives for the proposed anesthesia with the patient or authorized representative who has indicated his/her understanding and acceptance.     Plan Discussed with:   Anesthesia Plan Comments:         Anesthesia Quick Evaluation

## 2017-03-25 NOTE — ED Provider Notes (Signed)
[facility] Emergency Department Provider Note   ____________________________________________    I have reviewed the triage vital signs and the nursing notes.   HISTORY  Chief Complaint Fall   Patient is hard of hearing and speaks [name], he is unable to can indicate with the video interpreter because of his hearing difficulties.  Son is here who can communicate with patient to some degree  HPI Michael Shelton is a [age] y.o. male who presents after a fall.  Son reports the patient was sitting on a step stool that was approximately 10 inches off the floor and lost his balance and fell onto his right hip.  Has severe pain in right hip which is sharp in nature and worse with movement.  His only complaint is right hip pain.  No other injuries or pain reported.  No neck pain.  No back pain.  No abdominal pain or chest pain.  No dizziness.  No syncope.   Past Medical History:  Diagnosis Date  . GERD (gastroesophageal reflux disease)   . Hypertension     There are no active problems to display for this patient.   History reviewed. No pertinent surgical history.  Prior to Admission medications   Medication Sig Start Date End Date Taking? Authorizing Provider  famotidine (PEPCID) 40 MG tablet Take 40 mg by mouth daily. 09/12/15  Yes [provider]  hydrochlorothiazide (HYDRODIURIL) 12.5 MG tablet Take 12.5 mg by mouth daily.   Yes [provider]  montelukast (SINGULAIR) 10 MG tablet Take 10 mg by mouth daily. 09/12/15  Yes [provider]  Vitamin D, Ergocalciferol, (DRISDOL) 50000 units CAPS capsule Take 50,000 Units by mouth every 7 (seven) days.   Yes [provider]     Allergies Patient has no known allergies.  No family history on file.  Social History Social History   Tobacco Use  . Smoking status: Former Games developermoker  . Smokeless tobacco: Never Used  Substance Use Topics  . Alcohol use: No  . Drug use: No     Review of Systems  Constitutional: No dizziness Eyes: No visual changes.  ENT: No neck pain Cardiovascular: Denies chest pain. Respiratory: Denies shortness of breath. Gastrointestinal: No abdominal pain.   Genitourinary: Negative for dysuria. Musculoskeletal: Right hip pain Skin: Negative for laceration Neurological: Negative for headaches   ____________________________________________   PHYSICAL EXAM:  VITAL SIGNS: ED Triage Vitals  Enc Vitals Group     BP 03/25/17 0942 (!) 164/85     Pulse Rate 03/25/17 0942 64     Resp 03/25/17 0942 16     Temp 03/25/17 0942 (!) 96.7 F (35.9 C)     Temp Source 03/25/17 0942 Axillary     SpO2 03/25/17 0942 98 %     Weight 03/25/17 0940 44 kg (97 lb)     Height --      Head Circumference --      Peak Flow --      Pain Score --      Pain Loc --      Pain Edu? --      Excl. in GC? --     Constitutional: Alert and oriented. No acute distress.  Hard of hearing Eyes: Conjunctivae are normal.  Head: Atraumatic. Nose: No congestion/rhinnorhea. Mouth/Throat: Mucous membranes are moist.   Neck:  Painless ROM, no vertebral tenderness palpation Cardiovascular: Normal rate, regular rhythm. Grossly normal heart sounds.  Good peripheral circulation. Respiratory: Normal respiratory effort.  No  retractions. Lungs CTAB. Gastrointestinal: Soft and nontender. No distention.  No CVA tenderness. Genitourinary: deferred Musculoskeletal: Right leg is shortened and externally rotated.  He is unable to move it without severe pain.  Pain with passive movement as well.  Left leg is normal, no pain with axial load warm and well perfused laterally Neurologic:   No gross focal neurologic deficits are appreciated.  Skin:  Skin is warm, dry and intact. No rash noted.   ____________________________________________   LABS (all labs ordered are listed, but only abnormal results are displayed)  Labs Reviewed  CBC WITH DIFFERENTIAL/PLATELET -  Abnormal; Notable for the following components:      Result Value   Hemoglobin 12.6 (*)    HCT 39.5 (*)    MCHC 31.8 (*)    Platelets 113 (*)    Neutro Abs 6.8 (*)    Lymphs Abs 0.4 (*)    All other components within normal limits  COMPREHENSIVE METABOLIC PANEL - Abnormal; Notable for the following components:   Sodium 127 (*)    Chloride 89 (*)    Glucose, Bld 145 (*)    BUN 38 (*)    Creatinine, Ser 2.17 (*)    Total Bilirubin 1.6 (*)    GFR calc non Af Amer 23 (*)    GFR calc Af Amer 27 (*)    All other components within normal limits  PROTIME-INR  APTT  TYPE AND SCREEN   ____________________________________________  EKG  ED ECG REPORT I, Jene EveryKINNER, Mikhai Bienvenue, the attending physician, personally viewed and interpreted this ECG.  Date: 03/25/2017  Rate: 72 Rhythm: normal sinus rhythm QRS Axis: normal Intervals: normal ST/T Wave abnormalities: normal Narrative Interpretation: no evidence of acute ischemia  ____________________________________________  RADIOLOGY  Right intertrochanteric hip fracture ____________________________________________   PROCEDURES  Procedure(s) performed: No  Procedures   Critical Care performed: No ____________________________________________   INITIAL IMPRESSION / ASSESSMENT AND PLAN / ED COURSE  Pertinent labs & imaging results that were available during my care of the patient were reviewed by me and considered in my medical decision making (see chart for details).  Patient presents after fall injury.  Strong suspicion for right hip fracture.  Will obtain x-rays, labs give analgesics and reevaluate.  X-ray consistent with right intertrochanteric hip fracture.  Consult to Dr. Joice LoftsPoggi  Patient with mild hyponatremia and elevation of creatinine.  Will give IV fluids and admit to the hospitalist service    ____________________________________________   FINAL CLINICAL IMPRESSION(S) / ED DIAGNOSES  Final diagnoses:  Closed  fracture of right hip, initial encounter (HCC)  Hyponatremia        Note:  This document was prepared using Dragon voice recognition software and may include unintentional dictation errors.    Jene EveryKinner, Braeden Kennan, MD 03/25/17 [phone]

## 2017-03-25 NOTE — ED Notes (Signed)
Pt taken to OR by tech on stretcher. Son went with pt. 1A notified pt going to OR before floor.

## 2017-03-25 NOTE — Anesthesia Postprocedure Evaluation (Signed)
Anesthesia Post Note  Patient: Michael Shelton  Procedure(s) Performed: INTRAMEDULLARY (IM) NAIL INTERTROCHANTRIC (Right Hip)  Patient location during evaluation: PACU Anesthesia Type: General Level of consciousness: awake and alert Pain management: pain level controlled Vital Signs Assessment: post-procedure vital signs reviewed and stable Respiratory status: spontaneous breathing, nonlabored ventilation, respiratory function stable and patient connected to nasal cannula oxygen Cardiovascular status: blood pressure returned to baseline and stable Postop Assessment: no apparent nausea or vomiting Anesthetic complications: no     Last Vitals:  Vitals:   03/25/17 1941 03/25/17 1947  BP: (!) 102/58 104/70  Pulse: 81 (!) 59  Resp: (!) 23 19  Temp:  36.6 C  SpO2: 100% 100%    Last Pain:  Vitals:   03/25/17 1927  TempSrc:   PainSc: Asleep                 Lenard SimmerAndrew Axcel Horsch

## 2017-03-25 NOTE — ED Notes (Addendum)
Unable to use interpreter due to pt hearing loss, pt does not sign. Interpreter and MD aware

## 2017-03-25 NOTE — Consult Note (Signed)
ORTHOPAEDIC CONSULTATION  REQUESTING PHYSICIAN: Katha HammingKonidena, Snehalatha, MD  Chief Complaint:   Right hip pain.  History of Present Illness: Michael Shelton is a [age] y.o. male with a history of hypertension and gastroesophageal reflux disease who lives independently with his son. The patient normally ambulates without any assistive devices. Apparently, the patient was in his usual state of health this morning when he stepped up onto a step stool to get something out of a cabinet in his kitchen.  He lost his balance and fell onto his right hip. He was unable to get up. He was brought to the emergency room where x-rays demonstrated a displaced three-part intertrochanteric fracture of his right hip. The patient has been admitted at this time for definitive management of his injury. The patient apparently did not sustain any associated injury. He did not strike his head or lose consciousness. In addition, according to the son, the patient denies any lightheadedness, dizziness, chest pain, or other symptoms that might have precipitated his fall.  Past Medical History:  Diagnosis Date  . GERD (gastroesophageal reflux disease)   . Hypertension    History reviewed. No pertinent surgical history. Social History   Socioeconomic History  . Marital status: Widowed    Spouse name: None  . Number of children: None  . Years of education: None  . Highest education level: None  Social Needs  . Financial resource strain: None  . Food insecurity - worry: None  . Food insecurity - inability: None  . Transportation needs - medical: None  . Transportation needs - non-medical: None  Occupational History  . None  Tobacco Use  . Smoking status: Former Games developermoker  . Smokeless tobacco: Never Used  Substance and Sexual Activity  . Alcohol use: No  . Drug use: No  . Sexual activity: None  Other Topics Concern  . None  Social History Narrative  . None    No family history on file. No Known Allergies Prior to Admission medications   Medication Sig Start Date End Date Taking? Authorizing Provider  famotidine (PEPCID) 40 MG tablet Take 40 mg by mouth daily. 09/12/15  Yes [provider]  hydrochlorothiazide (HYDRODIURIL) 12.5 MG tablet Take 12.5 mg by mouth daily.   Yes [provider]  montelukast (SINGULAIR) 10 MG tablet Take 10 mg by mouth daily. 09/12/15  Yes [provider]  Vitamin D, Ergocalciferol, (DRISDOL) 50000 units CAPS capsule Take 50,000 Units by mouth every 7 (seven) days.   Yes [provider]   Dg Chest 1 View  Result Date: 03/25/2017 CLINICAL DATA:  Right hip fracture due to a fall today. Preoperative exam. EXAM: CHEST 1 VIEW COMPARISON:  PA and lateral chest 05/24/2005. FINDINGS: There is cardiomegaly without edema. Lungs are clear. No pneumothorax or pleural fluid. Atherosclerosis noted. IMPRESSION: Cardiomegaly without acute disease. Atherosclerosis. Electronically Signed   By: Drusilla Kannerhomas  Dalessio M.D.   On: 03/25/2017 11:31   Dg Hip Unilat With Pelvis 2-3 Views Right  Result Date: 03/25/2017 CLINICAL DATA:  Status post fall today with onset of right hip pain. Initial encounter. EXAM: DG HIP (WITH OR WITHOUT PELVIS) 2-3V RIGHT COMPARISON:  None. FINDINGS: The patient has an acute right intertrochanteric fracture. No other acute bony or joint abnormality is seen. Bones appear mildly osteopenic. Atherosclerosis noted. IMPRESSION: Acute right intertrochanteric fracture. Electronically Signed   By: Drusilla Kannerhomas  Dalessio M.D.   On: 03/25/2017 11:31    Positive ROS: All other systems have been reviewed and were otherwise negative with the  exception of those mentioned in the HPI and as above.  Physical Exam: General:  Alert, no acute distress Psychiatric:  Patient is competent for consent with normal mood and affect.  However, he speaks only [name] and is very hard of hearing. Cardiovascular:  No  pedal edema Respiratory:  No wheezing, non-labored breathing GI:  Abdomen is soft and non-tender Skin:  No lesions in the area of chief complaint Neurologic:  Sensation intact distally Lymphatic:  No axillary or cervical lymphadenopathy  Orthopedic Exam:  Orthopedic examination is limited to the right hip and lower extremity. The right lower extremity is somewhat shortened and externally rotated as compared to his left. Skin inspection around the right hip is unremarkable. There is no swelling, erythema, ecchymosis, or abrasions. He has tenderness to palpation of the lateral aspect of the right hip. He has more severe pain with any attempted active or passive motion of the hip. He is neurovascularly intact to the right lower extremity and foot.  X-rays:  Recent x-rays of the pelvis and right hip are available for review. These films demonstrate a displaced three-part intertrochanteric fracture of the right hip. The hip joint itself appears to be well maintained and without evidence for significant degenerative changes. No lytic lesions or other bony abnormalities are identified.  Assessment: Closed displaced 3-part intertrochanteric fracture right hip.  Plan: The treatment options are discussed with the patient's son who is the healthcare proxy for this patient, including both surgical and nonsurgical options. The patient's son would like to proceed with surgical intervention to include a trochanteric femoral nailing of the displaced intertrochanteric fracture of his right hip. This procedure has been discussed in detail, as have the potential risks (including bleeding, infection, nerve or blood vessel injury, persistent or recurrent pain, malunion and/or nonunion, stiffness, leg length inequality, need for further surgery, blood clots, strokes, heart attacks any arrhythmias, etc.) and benefits. The patient's son states his understanding and wishes to proceed. A consent has been signed by the son on  behalf of his father.  Thank you for asking me to participate in the care of this most pleasant man. I will be happy to follow him with you.   Maryagnes AmosJ. Jeffrey Poggi, MD  Beeper #:  [phone]  03/25/2017 3:11 PM

## 2017-03-25 NOTE — Transfer of Care (Signed)
Immediate Anesthesia Transfer of Care Note  Patient: Michael Shelton  Procedure(s) Performed: INTRAMEDULLARY (IM) NAIL INTERTROCHANTRIC (Right Hip)  Patient Location: PACU  Anesthesia Type:General  Level of Consciousness: sedated  Airway & Oxygen Therapy: Patient Spontanous Breathing and Patient connected to face mask oxygen  Post-op Assessment: Report given to RN and Post -op Vital signs reviewed and stable  Post vital signs: Reviewed and stable  Last Vitals:  Vitals:   03/25/17 1740 03/25/17 1742  BP: (!) 92/53 (!) 100/57  Pulse: 69 68  Resp: 15 17  Temp: (!) 36.2 C   SpO2: 100% 100%    Last Pain:  Vitals:   03/25/17 1359  TempSrc: Tympanic  PainSc: 0-No pain         Complications: No apparent anesthesia complications

## 2017-03-25 NOTE — Progress Notes (Signed)
Anticoagulation monitoring(Lovenox):  [age] yo  male ordered Lovenox 40 mg Q24h  Filed Weights   03/25/17 0940 03/25/17 1359  Weight: 97 lb (44 kg) 97 lb (44 kg)   BMI    Lab Results  Component Value Date   CREATININE 2.17 (H) 03/25/2017   CREATININE 1.17 10/05/2015   CREATININE 1.01 01/15/2012   Estimated Creatinine Clearance: 11 mL/min (A) (by C-G formula based on SCr of 2.17 mg/dL (H)). Hemoglobin & Hematocrit     Component Value Date/Time   HGB 12.6 (L) 03/25/2017 1007   HGB 13.4 01/15/2012 1530   HCT 39.5 (L) 03/25/2017 1007   HCT 41.2 01/15/2012 1530     Per Protocol for Patient with estCrcl < 30 ml/min and BMI < 40, will transition to heparin 5000 units SQ Q8H.

## 2017-03-25 NOTE — ED Notes (Signed)
The patient speaks NigerLao, and we did try an audio interpretter option.  Unfortunately, the patient cannot hear to use the interpretter and does not use sign language.  His son is helping with communication to patient.

## 2017-03-25 NOTE — ED Notes (Signed)
Lab at bedside for blood draw.

## 2017-03-25 NOTE — Anesthesia Procedure Notes (Signed)
Procedure Name: Intubation Date/Time: 03/25/2017 3:54 PM Performed by: Dionne Bucy, CRNA Pre-anesthesia Checklist: Patient identified, Patient being monitored, Timeout performed, Emergency Drugs available and Suction available Patient Re-evaluated:Patient Re-evaluated prior to induction Oxygen Delivery Method: Circle system utilized Preoxygenation: Pre-oxygenation with 100% oxygen Induction Type: IV induction Ventilation: Mask ventilation without difficulty Laryngoscope Size: Mac and 3 Grade View: Grade I Tube type: Oral Tube size: 7.0 mm Number of attempts: 1 Airway Equipment and Method: Stylet Placement Confirmation: ETT inserted through vocal cords under direct vision,  positive ETCO2 and breath sounds checked- equal and bilateral Secured at: 20 cm Tube secured with: Tape Dental Injury: Teeth and Oropharynx as per pre-operative assessment

## 2017-03-25 NOTE — ED Notes (Signed)
Son signed OR consent for pt. Pt given warm blanket.

## 2017-03-25 NOTE — Op Note (Signed)
03/25/2017  5:31 PM  Patient:   Michael Shelton  Pre-Op Diagnosis:   Closed displaced 3-part intertrochanteric fracture right hip.  Post-Op Diagnosis:   Same  Procedure:   Reduction and internal fixation of displaced intertrochanteric right hip fracture with short Biomet Affixis TFN nail.  Surgeon:   Maryagnes AmosJ. Jeffrey Selma Rodelo, MD  Assistant:   Marga HootsNicole Stephens, PA-S  Anesthesia:   GET  Findings:   As above  Complications:   None  EBL:   200 cc  Fluids:   600 cc crystalloid  UOP:   None  TT:   None  Drains:   None  Closure:   Staples  Implants:   Biomet Affixis 9 x 180 mm TFN with a 90 mm lag screw and a 30 mm distal interlocking screw  Brief Clinical Note:   The patient is a [age] year old [name] male who sustained the above-noted injury earlier today when he apparently lost his balance and fell while climbing up on a step stool to get something out of a cabinet in his kitchen. He was brought to the emergency room where x-rays demonstrated the above-noted injury. The patient has been cleared medically and presents at this time for reduction and internal fixation of the displaced 3-part intertrochanteric right hip fracture.  Procedure:   The patient was brought into the operating room. After adequate general endotracheal intubation and anesthesia was obtained, the patient was lain in the supine position on the fracture table. The uninjured leg was placed in a flexed and abducted position while the injured lower extremity was placed in longitudinal traction. The fracture was reduced using longitudinal traction and internal rotation. The adequacy of reduction was verified fluoroscopically in AP and lateral projections and found to be near anatomic. The lateral aspects of the right hip and thigh were prepped with ChloraPrep solution before being draped sterilely. Preoperative antibiotics were administered. A timeout was performed to verify the appropriate surgical site. The greater trochanter was  identified fluoroscopically and an approximately 4-5 cm incision made about 2-3 fingerbreadths above the tip of the greater trochanter. The incision was carried down through the subcutaneous tissues to expose the gluteal fascia. This was split the length of the incision, providing access to the tip of the trochanter. Under fluoroscopic guidance, a guidewire was drilled through the tip of the trochanter into the proximal metaphysis to the level of the lesser trochanter. After verifying its position fluoroscopically in AP and lateral projections, it was overreamed with the initial reamer to the depth of the lesser trochanter. A guidewire was passed down through the femoral canal to the supracondylar region. The adequacy of guidewire position was verified fluoroscopically in AP and lateral projections before the length of the guidewire within the canal was measured and found to be 340 mm. However, because of the amount of bowing in the femur and the fact that the femoral canal was rather narrow, it was felt best that the patient be fixed with a short nail rather than a long nail so as not to fracture the femur. Therefore, a 9 x 180 mm short nail was selected. The guidewire was overreamed sequentially using the flexible reamers, beginning with a 9 mm reamer and progressing to a 10.5 mm reamer.  The 9 x 180 mm short Biomet Affixis TFN rod was selected and advanced to the appropriate depth, as verified fluoroscopically in AP and lateral projections.   The guide system for the lag screw was positioned and advanced through an approximately 2 cm stab  incision over the lateral aspect of the proximal femur. The guidewire was drilled up through the trochanteric femoral nail and into the femoral neck to rest within 5 mm of subchondral bone. After verifying its position in the femoral neck and head in both AP and lateral projections, the guidewire was measured and found to be optimally replicated by a 90 mm lag screw. The  guidewire was overreamed to the appropriate depth before the lag screw was inserted and advanced to the appropriate depth as verified fluoroscopically in AP and lateral projections. The locking screw was advanced, then backed off a quarter turn to set the lag screw. Again the adequacy of hardware position and fracture reduction was verified fluoroscopically in AP and lateral projections and found to be excellent.  Next, preparations were made for inserting the distal interlocking screw through the same guide system. An approximate 1.5 cm stab incision was made over the skin at the appropriate point before the drill bit was advanced through the cortex and across the static position of the distal hole in the nail. The appropriate length of the screw was determined before the 30 mm distal interlocking screw was selected, then advanced and tightened securely. Again the adequacy of screw position was verified fluoroscopically in AP and lateral projections and found to be excellent.  The wounds were irrigated thoroughly with sterile saline solution before the abductor fascia was reapproximated using #1 Vicryl interrupted sutures. The subcutaneous tissues were closed using 2-0 Vicryl interrupted sutures. The skin was closed using staples. A total of 30 cc of 0.5% Sensorcaine with epinephrine was injected in and around all incisions. Sterile occlusive dressings were applied to all wounds before the patient was transferred back to his hospital bed. The patient was then transferred to the recovery room in satisfactory condition after tolerating the procedure well.

## 2017-03-25 NOTE — ED Notes (Signed)
Admitting at bedside 

## 2017-03-25 NOTE — Anesthesia Post-op Follow-up Note (Signed)
Anesthesia QCDR form completed.        

## 2017-03-25 NOTE — H&P (Signed)
[facility] [place] [facility]   PATIENT NAME: Michael Shelton    MR#:  [id number]  DATE OF BIRTH:  01/29/1914  DATE OF ADMISSION:  03/25/2017  PRIMARY CARE PHYSICIAN: Kandyce RudBabaoff, Marcus, MD   REQUESTING/REFERRING PHYSICIAN: Dr.Kinner  CHIEF COMPLAINT: Right hip pain   Chief Complaint  Patient presents with  . Fall    HISTORY OF PRESENT ILLNESS:  Michael Shelton  is a 83 y.o. male with a known history of hypertension, seasonal allergies had a fall this morning.  Unable to get up and suffered right hip fracture.  Patient is very hard of hearing.  Patient was sitting on a step stool about 10 inches off the floor and lost her balance and fell off on the right hip.  Patient had severe pain worse with movement.  No other injuries reported.  PAST MEDICAL HISTORY:   Past Medical History:  Diagnosis Date  . GERD (gastroesophageal reflux disease)   . Hypertension     PAST SURGICAL HISTOIRY:  History reviewed. No pertinent surgical history.  SOCIAL HISTORY:   Social History   Tobacco Use  . Smoking status: Former Games developermoker  . Smokeless tobacco: Never Used  Substance Use Topics  . Alcohol use: No    FAMILY HISTORY:  No family history on file.  DRUG ALLERGIES:  No Known Allergies  REVIEW OF SYSTEMS:  CONSTITUTIONAL: No fever, fatigue or weakness.  EYES: No blurred or double vision.  EARS, NOSE, AND THROAT: No tinnitus or ear pain.  RESPIRATORY: No cough, shortness of breath, wheezing or hemoptysis.  CARDIOVASCULAR: No chest pain, orthopnea, edema.  GASTROINTESTINAL: No nausea, vomiting, diarrhea or abdominal pain.  GENITOURINARY: No dysuria, hematuria.  ENDOCRINE: No polyuria, nocturia,  HEMATOLOGY: No anemia, easy bruising or bleeding SKIN: No rash or lesion. MUSCULOSKELETAL; right hip pain NEUROLOGIC: No tingling, numbness, weakness.  PSYCHIATRY: No anxiety or depression.   MEDICATIONS AT HOME:   Prior to Admission medications   Medication Sig Start  Date End Date Taking? Authorizing Provider  famotidine (PEPCID) 40 MG tablet Take 40 mg by mouth daily. 09/12/15  Yes [provider]  hydrochlorothiazide (HYDRODIURIL) 12.5 MG tablet Take 12.5 mg by mouth daily.   Yes [provider]  montelukast (SINGULAIR) 10 MG tablet Take 10 mg by mouth daily. 09/12/15  Yes [provider]  Vitamin D, Ergocalciferol, (DRISDOL) 50000 units CAPS capsule Take 50,000 Units by mouth every 7 (seven) days.   Yes [provider]      VITAL SIGNS:  Blood pressure (!) 159/82, pulse 69, temperature (!) 96.7 F (35.9 C), temperature source Axillary, resp. rate 16, weight 44 kg (97 lb), SpO2 100 %.  PHYSICAL EXAMINATION:  GENERAL:  83 y.o.-year-old patient lying in the bed with no acute distress.  EYES: Pupils equal, round, reactive to light and accommodation. No scleral icterus. Extraocular muscles intact.  HEENT: Head atraumatic, normocephalic. Oropharynx and nasopharynx clear.  NECK:  Supple, no jugular venous distention. No thyroid enlargement, no tenderness.  LUNGS: Normal breath sounds bilaterally, no wheezing, rales,rhonchi or crepitation. No use of accessory muscles of respiration.  CARDIOVASCULAR: S1, S2 normal. No murmurs, rubs, or gallops.  ABDOMEN: Soft, nontender, nondistended. Bowel sounds present. No organomegaly or mass.  EXTREMITIES: No pedal edema, cyanosis, or clubbing.  Right hip externally rotated, severe pain even with little movement. NEUROLOGIC: Unable to do full neurological exam because patient has severe pain in the right hip and not able to participate. PSYCHIATRIC: The patient is alert and  oriented x 3.  SKIN: No obvious rash, lesion, or ulcer.   LABORATORY PANEL:   CBC Recent Labs  Lab 03/25/17 1007  WBC 8.0  HGB 12.6*  HCT 39.5*  PLT 113*   ------------------------------------------------------------------------------------------------------------------  Chemistries  Recent Labs  Lab  03/25/17 1007  NA 127*  K 4.2  CL 89*  CO2 26  GLUCOSE 145*  BUN 38*  CREATININE 2.17*  CALCIUM 9.1  AST 38  ALT 28  ALKPHOS 62  BILITOT 1.6*   ------------------------------------------------------------------------------------------------------------------  Cardiac Enzymes No results for input(s): TROPONINI in the last 168 hours. ------------------------------------------------------------------------------------------------------------------  RADIOLOGY:  Dg Chest 1 View  Result Date: 03/25/2017 CLINICAL DATA:  Right hip fracture due to a fall today. Preoperative exam. EXAM: CHEST 1 VIEW COMPARISON:  PA and lateral chest 05/24/2005. FINDINGS: There is cardiomegaly without edema. Lungs are clear. No pneumothorax or pleural fluid. Atherosclerosis noted. IMPRESSION: Cardiomegaly without acute disease. Atherosclerosis. Electronically Signed   By: Drusilla Kannerhomas  Dalessio M.D.   On: 03/25/2017 11:31   Dg Hip Unilat With Pelvis 2-3 Views Right  Result Date: 03/25/2017 CLINICAL DATA:  Status post fall today with onset of right hip pain. Initial encounter. EXAM: DG HIP (WITH OR WITHOUT PELVIS) 2-3V RIGHT COMPARISON:  None. FINDINGS: The patient has an acute right intertrochanteric fracture. No other acute bony or joint abnormality is seen. Bones appear mildly osteopenic. Atherosclerosis noted. IMPRESSION: Acute right intertrochanteric fracture. Electronically Signed   By: Drusilla Kannerhomas  Dalessio M.D.   On: 03/25/2017 11:31    EKG:   Orders placed or performed during the hospital encounter of 03/25/17  . ED EKG  . ED EKG  . EKG 12-Lead  . EKG 12-Lead  EKG shows sinus rhythm with PVCs 72 bpm.  IMPRESSION AND PLAN:  83 year old male patient with history of essential hypertension, seasonal allergies had a fall and suffered a right hip fracture. 1. acute right intertrochanteric fracture; patient is at moderate to risk of surgery due to his advanced age, acute kidney injury,  hyponatremia.  Chronic renal failure chronic kidney disease stage III: Baseline creatinine around 1.6 based on his previous labs.  Secondary to his n.p.o. status, start IV hydration.  Hold hydrochlorothiazide. 3.  Hyponatremia: Patient's sodium was 128 on November 13 labs.  Continue gentle hydration and recheck labs. 4.  Dr. Joice LoftsPoggi is planning on surgery this afternoon for the right hip. Full code, prognosis and recovery from surgery is limited due to advanced age.  All the records are reviewed and case discussed with ED provider. Management plans discussed with the patient, family and they are in agreement.  CODE STATUS: full  TOTAL TIME TAKING CARE OF THIS PATIENT: 55minutes.    Katha HammingSnehalatha Jadden Yim M.D on 03/25/2017 at 12:53 PM  Between 7am to 6pm - Pager - [phone]  After 6pm go to [email or web address] - password [email or web address] [facility]  [facility]  Office  [phone]  CC: Primary care physician; Kandyce RudBabaoff, Marcus, MD  Note: This dictation was prepared with Dragon dictation along with smaller phrase technology. Any transcriptional errors that result from this process are unintentional.

## 2017-03-25 NOTE — ED Triage Notes (Signed)
Pt to ED via EMS from home with c/o witnessed mechanical fall. PT c/o RT hip pain, + rotation and shortening. PT has language barrier

## 2017-03-26 ENCOUNTER — Encounter: Payer: Self-pay | Admitting: Surgery

## 2017-03-26 LAB — CBC
HCT: 25.6 % — ABNORMAL LOW (ref 40.0–52.0)
Hemoglobin: 8.2 g/dL — ABNORMAL LOW (ref 13.0–18.0)
MCH: 26.8 pg (ref 26.0–34.0)
MCHC: 32.1 g/dL (ref 32.0–36.0)
MCV: 83.4 fL (ref 80.0–100.0)
PLATELETS: 78 10*3/uL — AB (ref 150–440)
RBC: 3.06 MIL/uL — AB (ref 4.40–5.90)
RDW: 14.1 % (ref 11.5–14.5)
WBC: 5.9 10*3/uL (ref 3.8–10.6)

## 2017-03-26 LAB — BASIC METABOLIC PANEL
ANION GAP: 11 (ref 5–15)
BUN: 46 mg/dL — ABNORMAL HIGH (ref 6–20)
CO2: 19 mmol/L — AB (ref 22–32)
CREATININE: 2.62 mg/dL — AB (ref 0.61–1.24)
Calcium: 7.4 mg/dL — ABNORMAL LOW (ref 8.9–10.3)
Chloride: 97 mmol/L — ABNORMAL LOW (ref 101–111)
GFR calc non Af Amer: 18 mL/min — ABNORMAL LOW (ref >60)
GFR, EST AFRICAN AMERICAN: 21 mL/min — AB (ref >60)
GLUCOSE: 201 mg/dL — AB (ref 65–99)
Potassium: 4.9 mmol/L (ref 3.5–5.1)
Sodium: 127 mmol/L — ABNORMAL LOW (ref 135–145)

## 2017-03-26 LAB — GLUCOSE, CAPILLARY: GLUCOSE-CAPILLARY: 182 mg/dL — AB (ref 65–99)

## 2017-03-26 MED ORDER — SODIUM CHLORIDE 0.9 % IV SOLN
INTRAVENOUS | Status: DC
  Administered 2017-03-26 (×2): via INTRAVENOUS

## 2017-03-26 NOTE — Progress Notes (Signed)
Physical Therapy Treatment Patient Details Name: Michael Shelton MRN: [id number] DOB: 11/09/1913 Today's Date: 03/26/2017    History of Present Illness Pt is a89 y.o.malewith a known history of hypertension, seasonal allergies had a fall this morning. Patient was sitting on a step stool about 10 inches off the floor and lost his balance and fell off on the right hip. Unable to get up and suffered a right intertrochanteric hip fracture. Patient had severe pain worse with movement. No other injuries reported.  Pt now s/p R hip IM nail.  Of note, pt speaks laotian but patient is hard of hearing and was not able to use the video interpreter.    PT Comments    Pt presents with deficits in strength, transfers, mobility, gait, balance, and activity tolerance.  Pt's son present to assist with interpreting.  Pt continues to require near total assist with bed mobility tasks with +2 assist provided for decreased pain to RLE with movement.  Pt remains very guarded with RLE movement of any kind.  Family education provided to encourage pt regarding benefits of movement and activity.  Pt required extensive assistance with transfers and was not able to take any true steps this session just some slight forward/backward shuffling of the RLE.  Pt will benefit from PT services in a SNF setting upon discharge to safely address above deficits for decreased caregiver assistance and eventual return to PLOF.     Follow Up Recommendations  SNF     Equipment Recommendations  Other (comment)    Recommendations for Other Services       Precautions / Restrictions Precautions Precautions: Fall Restrictions Weight Bearing Restrictions: Yes RLE Weight Bearing: Weight bearing as tolerated    Mobility  Bed Mobility Overal bed mobility: Needs Assistance Bed Mobility: Supine to Sit;Sit to Supine     Supine to sit: Max assist;+2 for physical assistance Sit to supine: +2 for physical assistance;Max assist   General  bed mobility comments: Pt near total assist for bed mobility tasks; +2 assistance for decreased pain with bed mobility tasks  Transfers Overall transfer level: Needs assistance Equipment used: Rolling walker (2 wheeled) Transfers: Sit to/from Stand Sit to Stand: Mod assist         General transfer comment: Tactile cues for sequencing with son attempting to assist with communication  Ambulation/Gait Ambulation/Gait assistance: Min assist Ambulation Distance (Feet): 1 Feet Assistive device: Rolling walker (2 wheeled) Gait Pattern/deviations: Step-to pattern;Trunk flexed;Antalgic   Gait velocity interpretation: <1.8 ft/sec, indicative of risk for recurrent falls General Gait Details: Minimal amb this session at EOB with pt quickly returning to sitting.   Stairs            Wheelchair Mobility    Modified Rankin (Stroke Patients Only)       Balance Overall balance assessment: Needs assistance Sitting-balance support: Feet unsupported;Feet supported;Bilateral upper extremity supported Sitting balance-Leahy Scale: Fair Sitting balance - Comments: Initial posterior lean in sitting but improved  Postural control: Posterior lean Standing balance support: Bilateral upper extremity supported Standing balance-Leahy Scale: Poor Standing balance comment: Min A for stability in standing                            Cognition Arousal/Alertness: Lethargic Behavior During Therapy: Flat affect Overall Cognitive Status: Difficult to assess  Exercises Total Joint Exercises Ankle Circles/Pumps: AAROM;Both;10 reps Short Arc QuadBarbaraann Boys: AAROM;Both;10 reps Heel Slides: AAROM;10 reps;Both(Gentle, small amplitude on RLE) Hip ABduction/ADduction: AAROM;Both;10 reps;5 reps(Gentle, small amplitude on RLE) Straight Leg Raises: AAROM;Both;10 reps;5 reps(Gentle, small amplitude on RLE) Long Arc Quad: AAROM;Right;5 reps Knee  Flexion: AAROM;Right;5 reps Other Exercises Other Exercises: Unsupported sitting at EOB with poor stability and posterior lean progressing to fair stability with upright sitting with SBA    General Comments        Pertinent Vitals/Pain Pain Assessment: (Pt unable to rate pain)    Home Living Family/patient expects to be discharged to:: Private residence(History obtained from family who assisted with communication with pt) Living Arrangements: Children Available Help at Discharge: Family;Available 24 hours/day Type of Home: House Home Access: Stairs to enter Entrance Stairs-Rails: None Home Layout: One level Home Equipment: None      Prior Function Level of Independence: Needs assistance  Gait / Transfers Assistance Needed: Ind amb without AD, Ind with transfers and bed mobility ADL's / Homemaking Assistance Needed: Ind with ADLs except for cooking and meds, family assists with these     PT Goals (current goals can now be found in the care plan section) Acute Rehab PT Goals PT Goal Formulation: Patient unable to participate in goal setting Time For Goal Achievement: 2017-01-28 Potential to Achieve Goals: Fair Progress towards PT goals: Progressing toward goals    Frequency    BID      PT Plan Current plan remains appropriate    Co-evaluation              AM-PAC PT "6 Clicks" Daily Activity  Outcome Measure  Difficulty turning over in bed (including adjusting bedclothes, sheets and blankets)?: Unable Difficulty moving from lying on back to sitting on the side of the bed? : Unable Difficulty sitting down on and standing up from a chair with arms (e.g., wheelchair, bedside commode, etc,.)?: Unable Help needed moving to and from a bed to chair (including a wheelchair)?: A Lot Help needed walking in hospital room?: Total Help needed climbing 3-5 steps with a railing? : Total 6 Click Score: 7    End of Session Equipment Utilized During Treatment: Gait  belt;Oxygen Activity Tolerance: Patient limited by pain Patient left: in bed;with call bell/phone within reach;with SCD's reapplied;with bed alarm set;with family/visitor present Nurse Communication: Mobility status PT Visit Diagnosis: Other abnormalities of gait and mobility (R26.89);Muscle weakness (generalized) (M62.81)     Time: [id number] PT Time Calculation (min) (ACUTE ONLY): 26 min  Charges:  $Therapeutic Exercise: 8-22 mins $Therapeutic Activity: 8-22 mins                    G Codes:  Functional Assessment Tool Used: AM-PAC 6 Clicks Basic Mobility Functional Limitation: Mobility: Walking and moving around Mobility: Walking and Moving Around Current Status ([id number](G8978): At least 80 percent but less than 100 percent impaired, limited or restricted Mobility: Walking and Moving Around Goal Status [phone](G8979): At least 20 percent but less than 40 percent impaired, limited or restricted    D. Elly ModenaScott Hiilei Gerst PT, DPT 03/26/17, 3:52 PM

## 2017-03-26 NOTE — Progress Notes (Signed)
Clinical Education officer, museum (CSW) met with patient's son Sit who reported that the family has decided to take patient home with home health. RN case manager aware of above.   [employer], LCSW [phone]

## 2017-03-26 NOTE — Progress Notes (Signed)
Initial Nutrition Assessment  DOCUMENTATION CODES:   Not applicable  INTERVENTION:  Cater to patient preferences. Monitor for needs. Family bringing protein supplements in he prefers.  NUTRITION DIAGNOSIS:   Increased nutrient needs related to wound healing as evidenced by estimated needs.  GOAL:   Patient will meet greater than or equal to 90% of their needs  MONITOR:   PO intake, I & O's, Labs, Weight trends  REASON FOR ASSESSMENT:   Malnutrition Screening Tool    ASSESSMENT:   Mr. Maisie FusLor speaks laotian, family at bedside for interpretation. Presents with fall, right hip fracture, now s/p intramedullary nail placement and intertrochanteric region.    Spoke with patient's daughter at bedside. Patient was sleeping soundly, did not awaken him. She reports patient's appetite has been good, normally eats 3 times a day. Denies weight loss. No issues chewing/swallowing/choking. Did not eat breakfast this morning because "he was not ready for it." Family is bringing soy drinks in for patient. No apparent needs  Labs reviewed:  CBGs 182 Na 127  Medications reviewed and include:  Colace, Vitamin D NS at 5375mL/hr LR at 2950mL/hr   NUTRITION - FOCUSED PHYSICAL EXAM:  Unable to complete at this time.  Diet Order:  Diet heart healthy/carb modified Room service appropriate? Yes; Fluid consistency: Thin  EDUCATION NEEDS:   Not appropriate for education at this time  Skin:  Skin Assessment: Skin Integrity Issues: Skin Integrity Issues:: Incisions Incisions: Closed incision to R Hip  Last BM:  PTA  Height:   Ht Readings from Last 1 Encounters:  03/25/17 5' (1.524 m)    Weight:   Wt Readings from Last 1 Encounters:  03/26/17 102 lb 12.8 oz (46.6 kg)    Ideal Body Weight:  48.18 kg  BMI:  Body mass index is 20.08 kg/m.  Estimated Nutritional Needs:   Kcal:  1350-1550 calories  Protein:  60-70 grams  Fluid:  >1.5L  Dionne AnoWilliam M. Kaiah Hosea, MS, RD LDN Inpatient  Clinical Dietitian Pager [phone]

## 2017-03-26 NOTE — Progress Notes (Signed)
  Subjective: 1 Day Post-Op Procedure(s) (LRB): INTRAMEDULLARY (IM) NAIL INTERTROCHANTRIC (Right) Patient reports pain as mild.   Patient is well, and has had no acute complaints or problems, patient does not speak english so the son provides interpretation. PT and Care management to assist with discharge. Negative for chest pain and shortness of breath Fever: no Gastrointestinal:Negative for nausea and vomiting  Objective: Vital signs in last 24 hours: Temp:  [96.7 F (35.9 C)-98.9 F (37.2 C)] 98 F (36.7 C) (11/30 0750) Pulse Rate:  [59-88] 77 (11/30 0750) Resp:  [11-23] 16 (11/30 0750) BP: (78-164)/(46-85) 114/57 (11/30 0750) SpO2:  [95 %-100 %] 100 % (11/30 0750) Weight:  [44 kg (97 lb)-46.6 kg (102 lb 12.8 oz)] 46.6 kg (102 lb 12.8 oz) (11/30 0500)  Intake/Output from previous day:  Intake/Output Summary (Last 24 hours) at 03/26/2017 0759 Last data filed at 03/25/2017 1953 Gross per 24 hour  Intake 1050 ml  Output 200 ml  Net 850 ml    Intake/Output this shift: No intake/output data recorded.  Labs: Recent Labs    03/25/17 1007 03/26/17 0253  HGB 12.6* 8.2*   Recent Labs    03/25/17 1007 03/26/17 0253  WBC 8.0 5.9  RBC 4.75 3.06*  HCT 39.5* 25.6*  PLT 113* 78*   Recent Labs    03/25/17 1007 03/26/17 0253  NA 127* 127*  K 4.2 4.9  CL 89* 97*  CO2 26 19*  BUN 38* 46*  CREATININE 2.17* 2.62*  GLUCOSE 145* 201*  CALCIUM 9.1 7.4*   Recent Labs    03/25/17 1007  INR 1.17     EXAM General - Patient is Alert, Appropriate and Oriented Extremity - ABD soft Intact pulses distally Dorsiflexion/Plantar flexion intact Incision: moderate drainage No cellulitis present Dressing/Incision - blood tinged drainage Motor Function - intact, moving foot and toes well on exam.   Abdomen soft with palpation, normal BS.  Past Medical History:  Diagnosis Date  . GERD (gastroesophageal reflux disease)   . Hypertension     Assessment/Plan: 1 Day  Post-Op Procedure(s) (LRB): INTRAMEDULLARY (IM) NAIL INTERTROCHANTRIC (Right) Active Problems:   Closed right hip fracture, initial encounter (HCC)  Estimated body mass index is 20.08 kg/m as calculated from the following:   Height as of this encounter: 5' (1.524 m).   Weight as of this encounter: 46.6 kg (102 lb 12.8 oz). Advance diet Up with therapy D/C IV fluids when tolerating po intake.  Labs reviewed, Na 127, encouraged increased PO intake. K+ 4.9 this AM. Up with therapy today. CBC and BMP tomorrow morning.  DVT Prophylaxis - Lovenox, Foot Pumps and TED hose Weight-Bearing as tolerated to right leg  J. Horris LatinoLance McGhee, PA-C [facility] 03/26/2017, 7:59 AM

## 2017-03-26 NOTE — Progress Notes (Signed)
Clinical Child psychotherapistocial Worker (CSW) presented bed offers to patient's daughter in law [name]. She reported that she will review offers with the family and get back to CSW.   Baker Hughes IncorporatedBailey Sharis Keeran, LCSW [phone]

## 2017-03-26 NOTE — Care Management Note (Signed)
Case Management Note  Patient Details  Name: Michael Shelton MRN: [id number] Date of Birth: 09/03/1913  Subjective/Objective: POD # 1 right IM nailing. Patient is Michael Shelton and is hard of hearing. He does not speak english. Spoke with son, Michael Shelton. Patient lives with his son and daughter Michael Shelton.  Offered home health agecnies and he has no preference. [facility]PT. Ordered walker and BSC from [facility].  [facility] [phone].  Called Lovenox 40 mg # 14 no refills. Verified order with Michael Shelton, Michael Shelton                    Action/Plan: [facility] for HHPT, DME with [facility] needs to be delivered. Called Lovenox in.   Expected Discharge Date:                  Expected Discharge Plan:  Home w Home Health Services  In-House Referral:     Discharge planning Services  CM Consult  Post Acute Care Choice:  Durable Medical Equipment, Home Health Choice offered to:  Adult Children  DME Arranged:  Bedside commode, Walker rolling DME Agency:  [facility]  HH Arranged:  PT HH Agency:  [facility]  Status of Service:  In process, will continue to follow  If discussed at Long Length of Stay Meetings, dates discussed:    Additional Comments:  Michael MemosLisa M Abbigaile Rockman, RN 03/26/2017, 3:21 PM

## 2017-03-26 NOTE — Clinical Social Work Placement (Signed)
   CLINICAL SOCIAL WORK PLACEMENT  NOTE  Date:  03/26/2017  Patient Details  Name: Dewitt Hoesen Polich MRN: [id number] Date of Birth: 03/24/1914  Clinical Social Work is seeking post-discharge placement for this patient at the Skilled  Nursing Facility level of care (*CSW will initial, date and re-position this form in  chart as items are completed):  Yes   Patient/family provided with [place] Clinical Social Work Department's list of facilities offering this level of care within the geographic area requested by the patient (or if unable, by the patient's family).  Yes   Patient/family informed of their freedom to choose among providers that offer the needed level of care, that participate in Medicare, Medicaid or managed care program needed by the patient, have an available bed and are willing to accept the patient.  Yes   Patient/family informed of [facility]'s ownership interest in [facility] and [facility], as well as of the fact that they are under no obligation to receive care at these facilities.  PASRR submitted to EDS on 03/26/17     PASRR number received on 03/26/17     Existing PASRR number confirmed on       FL2 transmitted to all facilities in geographic area requested by pt/family on 03/26/17     FL2 transmitted to all facilities within larger geographic area on       Patient informed that his/her managed care company has contracts with or will negotiate with certain facilities, including the following:            Patient/family informed of bed offers received.  Patient chooses bed at       Physician recommends and patient chooses bed at      Patient to be transferred to   on  .  Patient to be transferred to facility by       Patient family notified on   of transfer.  Name of family member notified:        PHYSICIAN       Additional Comment:    _______________________________________________ Marcellius Montagna, Darleen CrockerBailey M, LCSW 03/26/2017, 10:49 AM

## 2017-03-26 NOTE — Plan of Care (Signed)
RN unable to obtain UA sample.  Patient incontinent.  Staff will continue to assess and obtain sample if possible.

## 2017-03-26 NOTE — Evaluation (Signed)
Physical Therapy Evaluation Patient Details Name: Michael Shelton MRN: [id number] DOB: 08/30/1913 Today's Date: 03/26/2017   History of Present Illness  Pt is a89 y.o.malewith a known history of hypertension, seasonal allergies had a fall this morning. Patient was sitting on a step stool about 10 inches off the floor and lost his balance and fell off on the right hip. Unable to get up and suffered a right intertrochanteric hip fracture. Patient had severe pain worse with movement. No other injuries reported.  Pt now s/p R hip IM nail.  Of note, pt speaks laotian but patient is hard of hearing and was not able to use the video interpreter.    Clinical Impression  Pt speaks laotian but patient is hard of hearing and was not able to use the video interpreter per chart review.  Family at bedside assisted with history and with limited communication with pt.  Pt presents with deficits in strength, transfers, mobility, gait, balance, and activity tolerance.  Pt required near total assist with bed mobility and was very guarded with RLE movement.  Pt able to stand at EOB with mod A and tactile cues for sequencing.  Pt able to take several very small steps at EOB, mostly shuffling BLEs on the floor.  Pt will benefit from PT services in a SNF setting upon discharge to safely address above deficits for decreased caregiver assistance and eventual return to PLOF.       Follow Up Recommendations SNF    Equipment Recommendations  Other (comment)(TBD at next venue of care)    Recommendations for Other Services       Precautions / Restrictions Precautions Precautions: Fall Restrictions Weight Bearing Restrictions: Yes RLE Weight Bearing: Weight bearing as tolerated      Mobility  Bed Mobility Overal bed mobility: Needs Assistance Bed Mobility: Supine to Sit;Sit to Supine     Supine to sit: Max assist Sit to supine: Max assist   General bed mobility comments: Pt near total assist for bed mobility  tasks  Transfers Overall transfer level: Needs assistance Equipment used: Rolling walker (2 wheeled) Transfers: Sit to/from Stand Sit to Stand: Mod assist         General transfer comment: Tactile cues for sequencing  Ambulation/Gait Ambulation/Gait assistance: Min assist Ambulation Distance (Feet): 3 Feet Assistive device: Rolling walker (2 wheeled) Gait Pattern/deviations: Step-to pattern;Trunk flexed;Antalgic   Gait velocity interpretation: <1.8 ft/sec, indicative of risk for recurrent falls General Gait Details: Very slow cadence with effortful steps and min A for stability.  Stairs            Wheelchair Mobility    Modified Rankin (Stroke Patients Only)       Balance Overall balance assessment: Needs assistance Sitting-balance support: Feet unsupported;Feet supported;Bilateral upper extremity supported Sitting balance-Leahy Scale: Fair     Standing balance support: Bilateral upper extremity supported Standing balance-Leahy Scale: Poor Standing balance comment: Min A for stability in standing                             Pertinent Vitals/Pain Pain Assessment: (Pt unable to rate pain)    Home Living Family/patient expects to be discharged to:: Private residence(History obtained from family who assisted with communication with pt) Living Arrangements: Children Available Help at Discharge: Family;Available 24 hours/day Type of Home: House Home Access: Stairs to enter Entrance Stairs-Rails: None Entrance Stairs-Number of Steps: 3 Home Layout: One level Home Equipment: None  Prior Function Level of Independence: Needs assistance   Gait / Transfers Assistance Needed: Ind amb without AD, Ind with transfers and bed mobility  ADL's / Homemaking Assistance Needed: Ind with ADLs except for cooking and meds, family assists with these        Hand Dominance        Extremity/Trunk Assessment        Lower Extremity Assessment Lower  Extremity Assessment: Generalized weakness;RLE deficits/detail RLE: Unable to fully assess due to pain       Communication      Cognition Arousal/Alertness: Awake/alert Behavior During Therapy: WFL for tasks assessed/performed Overall Cognitive Status: Difficult to assess                                        General Comments      Exercises Total Joint Exercises Ankle Circles/Pumps: AAROM;Both;10 reps;5 reps Heel Slides: AAROM;Both;10 reps;5 reps(Gentle, small amplitude on the RLE) Hip ABduction/ADduction: AAROM;Both;10 reps;5 reps(Gentle, small amplitude on the RLE) Straight Leg Raises: AAROM;Both;10 reps;5 reps(Gentle, small amplitude on the RLE) Long Arc Quad: AAROM;Both;10 reps;5 reps Knee Flexion: AAROM;Both;10 reps;5 reps   Assessment/Plan    PT Assessment Patient needs continued PT services  PT Problem List Decreased strength;Decreased activity tolerance;Decreased balance;Decreased knowledge of use of DME;Decreased mobility       PT Treatment Interventions DME instruction;Stair training;Gait training;Functional mobility training;Neuromuscular re-education;Balance training;Therapeutic exercise;Therapeutic activities;Patient/family education    PT Goals (Current goals can be found in the Care Plan section)  Acute Rehab PT Goals PT Goal Formulation: Patient unable to participate in goal setting Time For Goal Achievement: 04/29/2016 Potential to Achieve Goals: Fair    Frequency BID   Barriers to discharge        Co-evaluation               AM-PAC PT "6 Clicks" Daily Activity  Outcome Measure Difficulty turning over in bed (including adjusting bedclothes, sheets and blankets)?: Unable Difficulty moving from lying on back to sitting on the side of the bed? : Unable Difficulty sitting down on and standing up from a chair with arms (e.g., wheelchair, bedside commode, etc,.)?: Unable Help needed moving to and from a bed to chair (including a  wheelchair)?: A Lot Help needed walking in hospital room?: Total Help needed climbing 3-5 steps with a railing? : Total 6 Click Score: 7    End of Session Equipment Utilized During Treatment: Gait belt;Oxygen Activity Tolerance: Patient limited by pain Patient left: in bed;with call bell/phone within reach;with SCD's reapplied;with bed alarm set;with family/visitor present Nurse Communication: Mobility status PT Visit Diagnosis: Other abnormalities of gait and mobility (R26.89);Muscle weakness (generalized) (M62.81)    Time: [id number] PT Time Calculation (min) (ACUTE ONLY): 31 min   Charges:   PT Evaluation $PT Eval Low Complexity: 1 Low PT Treatments $Therapeutic Exercise: 8-22 mins   PT G Codes:   PT G-Codes **NOT FOR INPATIENT CLASS** Functional Assessment Tool Used: AM-PAC 6 Clicks Basic Mobility Functional Limitation: Mobility: Walking and moving around Mobility: Walking and Moving Around Current Status (V4098(G8978): At least 80 percent but less than 100 percent impaired, limited or restricted Mobility: Walking and Moving Around Goal Status [phone](G8979): At least 20 percent but less than 40 percent impaired, limited or restricted    D. Scott Emrey Thornley PT, DPT 03/26/17, 1:05 PM

## 2017-03-26 NOTE — Clinical Social Work Note (Signed)
Clinical Social Work Assessment  Patient Details  Name: Michael Shelton MRN: [id number] Date of Birth: [birth year]  Date of referral:  03/26/17               Reason for consult:  Facility Placement                Permission sought to share information with:  Chartered certified accountant granted to share information::  Yes, Verbal Permission Granted  Name::      [place]::   [place]   Relationship::     Contact Information:     Housing/Transportation Living arrangements for the past 2 months:  [place] of Information:  Other (Comment Required)(Daughter in Sports coach ) Patient Interpreter Needed:  Other (Comment Required)(Patient does not speak engilsh and is hard of hearing. Per nurse an interpreter for [place] is not avaiable and patient's family speaks [place]. ) Criminal Activity/Legal Involvement Pertinent to Current Situation/Hospitalization:  No - Comment as needed Significant Relationships:  Adult Children, Other Family Members Lives with:  Adult Children Do you feel safe going back to the place where you live?    Need for family participation in patient care:  Yes (Comment)  Care giving concerns:  Patient lives in [place] with his son Enzo Bi and daughter in law [place].    Social Worker assessment / plan:  Holiday representative (CSW) received SNF consult. Per nurse PT is recommending SNF. CSW met with patient and his daughter in law Orson Aloe was at bedside. Patient was asleep and did not participate in assessment. Per nurse patient does not speak english and is hard of hearing. Per nurse an interpreter for [place] is not available and patient's family speaks english. Per daughter in law patient lives in [place] with her and her husband, which is patient's son. CSW explained SNF process and that medicare requires a 3 night qualifying inpatient stay in a hospital in order to pay for SNF. Patient was admitted to inpatient on  03/25/17. Daughter is agreeable to SNF search in [facility] and stated that [facility] [facility] is close to their home. FL2 complete and faxed out. CSW will continue to follow and assist as needed.   Employment status:  Retired Forensic scientist:  Information systems manager, Medicaid In [employer] PT Recommendations:  [place] / Referral to community resources:  [place]  Patient/Family's Response to care:  Patient's daughter in law is agreeable to SNF search in [place].   Patient/Family's Understanding of and Emotional Response to Diagnosis, Current Treatment, and Prognosis:  Patient's daughter in law was very pleasant and thanked CSW for assistance.   Emotional Assessment Appearance:  Appears stated age Attitude/Demeanor/Rapport:  Unable to Assess Affect (typically observed):  Unable to Assess Orientation:  Oriented to Self, Oriented to Place, Oriented to  Time, Oriented to Situation Alcohol / Substance use:  Not Applicable Psych involvement (Current and /or in the community):  No (Comment)  Discharge Needs  Concerns to be addressed:  Discharge Planning Concerns Readmission within the last 30 days:  No Current discharge risk:  Dependent with Mobility Barriers to Discharge:  Continued Medical Work up   [employer], Ver[place] Beets, LCSW 03/26/2017, 10:51 AM

## 2017-03-26 NOTE — Progress Notes (Signed)
[facility] -  at [facility]   PATIENT NAME: Michael Shelton    MR#:  [id number]  DATE OF BIRTH:  11/29/1913  SUBJECTIVE:  CHIEF COMPLAINT:   Chief Complaint  Patient presents with  . Fall   - lying in bed, POD #1 after right hip intertrochanteric intramedullary nail placement. Speaks Laotian. Family at bedside for interpretation. Abdomen marked in with physical therapy. Likely will need rehabilitation discharged  REVIEW OF SYSTEMS:  Review of Systems  Constitutional: Negative for chills and fever.  HENT: Positive for hearing loss. Negative for congestion, ear discharge and nosebleeds.   Eyes: Negative for blurred vision and double vision.  Respiratory: Negative for cough, shortness of breath and wheezing.   Cardiovascular: Negative for chest pain, palpitations and leg swelling.  Gastrointestinal: Negative for abdominal pain, constipation, diarrhea, nausea and vomiting.  Genitourinary: Negative for dysuria.  Musculoskeletal: Positive for myalgias.  Neurological: Positive for weakness. Negative for dizziness, speech change, focal weakness, seizures and headaches.  Psychiatric/Behavioral: Negative for depression.    DRUG ALLERGIES:  No Known Allergies  VITALS:  Blood pressure (!) 114/57, pulse 77, temperature 98 F (36.7 C), temperature source Oral, resp. rate 16, height 5' (1.524 m), weight 46.6 kg (102 lb 12.8 oz), SpO2 100 %.  PHYSICAL EXAMINATION:  Physical Exam  GENERAL:  83 y.o.-year-old elderly patient lying in the bed with no acute distress.  EYES: Pupils equal, round, reactive to light and accommodation. No scleral icterus. Extraocular muscles intact.  HEENT: Head atraumatic, normocephalic. Oropharynx and nasopharynx clear.  NECK:  Supple, no jugular venous distention. No thyroid enlargement, no tenderness.  LUNGS: Normal breath sounds bilaterally, no wheezing, rales,rhonchi or crepitation. No use of accessory muscles of respiration. Decreased at  the bases CARDIOVASCULAR: S1, S2 normal. No murmurs, rubs, or gallops.  ABDOMEN: Soft, nontender, nondistended. Bowel sounds present. No organomegaly or mass.  EXTREMITIES: No pedal edema, cyanosis, or clubbing. Right hip dressing in place with minimal swelling. NEUROLOGIC: Cranial nerves II through XII are intact. Muscle strength 5/5 in all extremities. Sensation intact. Gait not checked. Following simple commands PSYCHIATRIC: The patient is alert and oriented.  SKIN: No obvious rash, lesion, or ulcer.    LABORATORY PANEL:   CBC Recent Labs  Lab 03/26/17 0253  WBC 5.9  HGB 8.2*  HCT 25.6*  PLT 78*   ------------------------------------------------------------------------------------------------------------------  Chemistries  Recent Labs  Lab 03/25/17 1007 03/26/17 0253  NA 127* 127*  K 4.2 4.9  CL 89* 97*  CO2 26 19*  GLUCOSE 145* 201*  BUN 38* 46*  CREATININE 2.17* 2.62*  CALCIUM 9.1 7.4*  AST 38  --   ALT 28  --   ALKPHOS 62  --   BILITOT 1.6*  --    ------------------------------------------------------------------------------------------------------------------  Cardiac Enzymes No results for input(s): TROPONINI in the last 168 hours. ------------------------------------------------------------------------------------------------------------------  RADIOLOGY:  Dg Chest 1 View  Result Date: 03/25/2017 CLINICAL DATA:  Right hip fracture due to a fall today. Preoperative exam. EXAM: CHEST 1 VIEW COMPARISON:  PA and lateral chest 05/24/2005. FINDINGS: There is cardiomegaly without edema. Lungs are clear. No pneumothorax or pleural fluid. Atherosclerosis noted. IMPRESSION: Cardiomegaly without acute disease. Atherosclerosis. Electronically Signed   By: Drusilla Kannerhomas  Dalessio M.D.   On: 03/25/2017 11:31   Dg Hip Operative Unilat W Or W/o Pelvis Right  Result Date: 03/25/2017 CLINICAL DATA:  Right hip fracture. EXAM: OPERATIVE RIGHT HIP (WITH PELVIS IF PERFORMED) 7  VIEWS TECHNIQUE: Fluoroscopic spot image(s) were submitted for interpretation post-operatively.  COMPARISON:  Radiographs of earlier today. FINDINGS: Intraoperative imaging demonstrates placement of intramedullary screw and nail fixation device. No acute hardware complication or interval fracture. IMPRESSION: Intraoperative imaging of proximal right femoral fixation. Electronically Signed   By: Jeronimo GreavesKyle  Talbot M.D.   On: 03/25/2017 19:58   Dg Hip Unilat With Pelvis 2-3 Views Right  Result Date: 03/25/2017 CLINICAL DATA:  Status post fall today with onset of right hip pain. Initial encounter. EXAM: DG HIP (WITH OR WITHOUT PELVIS) 2-3V RIGHT COMPARISON:  None. FINDINGS: The patient has an acute right intertrochanteric fracture. No other acute bony or joint abnormality is seen. Bones appear mildly osteopenic. Atherosclerosis noted. IMPRESSION: Acute right intertrochanteric fracture. Electronically Signed   By: Drusilla Kannerhomas  Dalessio M.D.   On: 03/25/2017 11:31    EKG:   Orders placed or performed during the hospital encounter of 03/25/17  . ED EKG  . ED EKG  . EKG 12-Lead  . EKG 12-Lead    ASSESSMENT AND PLAN:     #1 right hip fracture-status post intramedullary nail placement and intertrochanteric region. -Postoperative day 1 today. Continue physical therapy. Pain control as tolerated. -Avoid too strong medications to prevent delirium. -We'll check urinalysis to rule out any infection -Monitor postop hemoglobin  #2 hyponatremia-likely from hypovolemia, encourage oral intake. -Continue IV fluids for now -Recent outpatient labs also indicated that his sodium has been low at 128.  #3 acute renal failure on CK D stage III. Baseline creatinine seems to be around 2 -Continue gentle hydration. Change IV fluids to without potassium as his potassium is trending  #4 DVT prophylaxis-on subcutaneous heparin.  Continue physical therapy   All the records are reviewed and case discussed with Care  Management/Social Workerr. Management plans discussed with the patient, family and they are in agreement.  CODE STATUS: Full code  TOTAL TIME TAKING CARE OF THIS PATIENT: 38 minutes.   POSSIBLE D/C IN 2-3 DAYS, DEPENDING ON CLINICAL CONDITION.   Enid BaasKALISETTI,Bode Pieper M.D on 03/26/2017 at 10:50 AM  Between 7am to 6pm - Pager - [phone]  After 6pm go to [email or web address] - Social research officer, governmentpassword EPAS [facility]  [facility] [place] [facility]  Office  [phone]  CC: Primary care physician; Kandyce RudBabaoff, Marcus, MD

## 2017-03-26 NOTE — [place] FL2 (Signed)
[place] MEDICAID FL2 LEVEL OF CARE SCREENING TOOL     IDENTIFICATION  Patient Name: Michael Shelton Birthdate: 05/28/1913 Sex: male Admission Date (Current Location): 03/25/2017  [facility] [place]  [place] ([id number]) Facility and Address:  [facility], [address], [place], [place] [address]      Provider Number: [id number]  Attending Physician Name and Address:  Enid BaasKalisetti, Radhika, MD  Relative Name and Phone Number:       Current Level of Care: Hospital Recommended Level of Care: Skilled Nursing Facility Prior Approval Number:    Date Approved/Denied:   PASRR Number: [id number])  Discharge Plan: SNF    Current Diagnoses: Patient Active Problem List   Diagnosis Date Noted  . Closed right hip fracture, initial encounter (HCC) 03/25/2017    Orientation RESPIRATION BLADDER Height & Weight     Time, Self, Situation, Place  O2(2 Liters Oxygen. ) Continent Weight: 102 lb 12.8 oz (46.6 kg) Height:  5' (152.4 cm)  BEHAVIORAL SYMPTOMS/MOOD NEUROLOGICAL BOWEL NUTRITION STATUS      Continent Diet(Diet: Heart Healthy/ Card Modified. )  AMBULATORY STATUS COMMUNICATION OF NEEDS Skin   Extensive Assist Verbally Surgical wounds(Incision: Right Hip. )                       Personal Care Assistance Level of Assistance  Bathing, Feeding, Dressing Bathing Assistance: Limited assistance Feeding assistance: Independent Dressing Assistance: Limited assistance     Functional Limitations Info  Sight, Hearing, Speech Sight Info: Adequate Hearing Info: Impaired(Hard of Hearing. ) Speech Info: Adequate    SPECIAL CARE FACTORS FREQUENCY  PT (By licensed PT), OT (By licensed OT)     PT Frequency: (5) OT Frequency: (5)            Contractures      Additional Factors Info  Code Status, Allergies Code Status Info: (Full Code. ) Allergies Info: (No Known Allergies. )           Current Medications (03/26/2017):  This is the  current hospital active medication list Current Facility-Administered Medications  Medication Dose Route Frequency Provider Last Rate Last Dose  . acetaminophen (TYLENOL) tablet 650 mg  650 mg Oral Q4H PRN Poggi, Excell SeltzerJohn J, MD       Or  . acetaminophen (TYLENOL) suppository 650 mg  650 mg Rectal Q4H PRN Poggi, Excell SeltzerJohn J, MD      . acetaminophen (TYLENOL) tablet 1,000 mg  1,000 mg Oral Q6H Poggi, Excell SeltzerJohn J, MD   1,000 mg at 03/26/17 0750  . bisacodyl (DULCOLAX) EC tablet 5 mg  5 mg Oral Daily PRN Katha HammingKonidena, Snehalatha, MD      . bisacodyl (DULCOLAX) suppository 10 mg  10 mg Rectal Daily PRN Poggi, Excell SeltzerJohn J, MD      . dextrose 5 % and 0.45 % NaCl with KCl 20 mEq/L infusion   Intravenous Continuous Poggi, Excell SeltzerJohn J, MD 75 mL/hr at 03/26/17 1003    . diphenhydrAMINE (BENADRYL) 12.5 MG/5ML elixir 12.5-25 mg  12.5-25 mg Oral Q4H PRN Poggi, Excell SeltzerJohn J, MD      . docusate sodium (COLACE) capsule 100 mg  100 mg Oral BID Katha HammingKonidena, Snehalatha, MD   100 mg at 03/26/17 1001  . heparin injection 5,000 Units  5,000 Units Subcutaneous Q8H Katha HammingKonidena, Snehalatha, MD   5,000 Units at 03/26/17 0520  . HYDROmorphone (DILAUDID) injection 0.5-1 mg  0.5-1 mg Intravenous Q2H PRN Poggi, Excell SeltzerJohn J, MD      . lactated ringers  infusion   Intravenous Continuous Naomie DeanKephart, William K, MD      . magnesium hydroxide (MILK OF MAGNESIA) suspension 30 mL  30 mL Oral Daily PRN Poggi, Excell SeltzerJohn J, MD      . metoCLOPramide (REGLAN) tablet 5-10 mg  5-10 mg Oral Q8H PRN Poggi, Excell SeltzerJohn J, MD       Or  . metoCLOPramide (REGLAN) injection 5-10 mg  5-10 mg Intravenous Q8H PRN Poggi, Excell SeltzerJohn J, MD      . montelukast (SINGULAIR) tablet 10 mg  10 mg Oral Daily Katha HammingKonidena, Snehalatha, MD   10 mg at 03/26/17 1001  . ondansetron (ZOFRAN) tablet 4 mg  4 mg Oral Q6H PRN Poggi, Excell SeltzerJohn J, MD       Or  . ondansetron (ZOFRAN) injection 4 mg  4 mg Intravenous Q6H PRN Poggi, Excell SeltzerJohn J, MD      . oxyCODONE (Oxy IR/ROXICODONE) immediate release tablet 10 mg  10 mg Oral Q3H PRN Poggi, Excell SeltzerJohn J, MD       . oxyCODONE (Oxy IR/ROXICODONE) immediate release tablet 5 mg  5 mg Oral Q3H PRN Poggi, Excell SeltzerJohn J, MD      . sodium phosphate (FLEET) 7-19 GM/118ML enema 1 enema  1 enema Rectal Once PRN Poggi, Excell SeltzerJohn J, MD      . Vitamin D (Ergocalciferol) (DRISDOL) capsule 50,000 Units  50,000 Units Oral Q7 days Katha HammingKonidena, Snehalatha, MD   50,000 Units at 03/26/17 1001     Discharge Medications: Please see discharge summary for a list of discharge medications.  Relevant Imaging Results:  Relevant Lab Results:   Additional Information (SSN: [id number] )  Madeliene Tejera, Darleen CrockerBailey M, LCSW

## 2017-03-26 NOTE — Progress Notes (Signed)
Patient unable to get out of bed after surgery due to drowsiness, generalized weakness and fatigue .

## 2017-03-26 NOTE — Care Management (Signed)
RNCM consult for discharge planning. PT recommending SNF. Family agreeable to bed search. Will sign off.

## 2017-03-27 ENCOUNTER — Inpatient Hospital Stay: Payer: Medicare Other

## 2017-03-27 LAB — BASIC METABOLIC PANEL
Anion gap: 6 (ref 5–15)
BUN: 61 mg/dL — ABNORMAL HIGH (ref 6–20)
CHLORIDE: 101 mmol/L (ref 101–111)
CO2: 21 mmol/L — ABNORMAL LOW (ref 22–32)
Calcium: 7.5 mg/dL — ABNORMAL LOW (ref 8.9–10.3)
Creatinine, Ser: 3.46 mg/dL — ABNORMAL HIGH (ref 0.61–1.24)
GFR calc non Af Amer: 13 mL/min — ABNORMAL LOW (ref >60)
GFR, EST AFRICAN AMERICAN: 15 mL/min — AB (ref >60)
Glucose, Bld: 118 mg/dL — ABNORMAL HIGH (ref 65–99)
POTASSIUM: 4.9 mmol/L (ref 3.5–5.1)
SODIUM: 128 mmol/L — AB (ref 135–145)

## 2017-03-27 LAB — URINALYSIS, COMPLETE (UACMP) WITH MICROSCOPIC
Bilirubin Urine: NEGATIVE
Glucose, UA: NEGATIVE mg/dL
KETONES UR: NEGATIVE mg/dL
Leukocytes, UA: NEGATIVE
Nitrite: NEGATIVE
PH: 5 (ref 5.0–8.0)
PROTEIN: NEGATIVE mg/dL
Specific Gravity, Urine: 1.014 (ref 1.005–1.030)

## 2017-03-27 LAB — PROTEIN / CREATININE RATIO, URINE
Creatinine, Urine: 85 mg/dL
Protein Creatinine Ratio: 0.29 mg/mg{Cre} — ABNORMAL HIGH (ref 0.00–0.15)
Total Protein, Urine: 25 mg/dL

## 2017-03-27 LAB — CBC
HCT: 22.3 % — ABNORMAL LOW (ref 40.0–52.0)
HEMOGLOBIN: 7.2 g/dL — AB (ref 13.0–18.0)
MCH: 26.9 pg (ref 26.0–34.0)
MCHC: 32.3 g/dL (ref 32.0–36.0)
MCV: 83.2 fL (ref 80.0–100.0)
Platelets: 80 10*3/uL — ABNORMAL LOW (ref 150–440)
RBC: 2.68 MIL/uL — AB (ref 4.40–5.90)
RDW: 14.1 % (ref 11.5–14.5)
WBC: 10.6 10*3/uL (ref 3.8–10.6)

## 2017-03-27 LAB — HEMOGLOBIN AND HEMATOCRIT, BLOOD
HCT: 24.9 % — ABNORMAL LOW (ref 40.0–52.0)
HEMOGLOBIN: 8.2 g/dL — AB (ref 13.0–18.0)

## 2017-03-27 LAB — PREPARE RBC (CROSSMATCH)

## 2017-03-27 LAB — GLUCOSE, CAPILLARY: GLUCOSE-CAPILLARY: 122 mg/dL — AB (ref 65–99)

## 2017-03-27 LAB — ABO/RH: ABO/RH(D): O POS

## 2017-03-27 MED ORDER — SODIUM CHLORIDE 0.9 % IV SOLN
Freq: Once | INTRAVENOUS | Status: AC
  Administered 2017-03-27: 16:00:00 via INTRAVENOUS

## 2017-03-27 MED ORDER — OXYCODONE HCL 5 MG PO TABS: 5.0000 mg | ORAL_TABLET | ORAL | 0 refills | Status: AC | PRN

## 2017-03-27 MED ORDER — SODIUM CHLORIDE 0.9 % IV SOLN
INTRAVENOUS | Status: AC
  Administered 2017-03-27: 13:00:00 via INTRAVENOUS

## 2017-03-27 NOTE — Consult Note (Signed)
[facility]  CONSULT NOTE    Date: 03/27/2017                  Patient Name:  Michael Shelton  MRN: [id number]  DOB: 04/05/1914  Age / Sex: 83 y.o., male         PCP: Kandyce RudBabaoff, Marcus, MD                 Service Requesting Consult: Dr. Nemiah CommanderKalisetti                 Reason for Consult: Acute Renal Failure            History of Present Illness: Mr. Michael Shelton is a [age] y.o. Asian [place]ian) male with hypertension and GERD, who was [facility] on 03/25/2017 for Hyponatremia [E87.1] Closed fracture of right hip, initial encounter [facility](HCC) [S72.001A]  Patient underwent right hip surgery by Dr. Joice LoftsPoggi on 11/29.   Creatinine 2.17 on admission and now has trended to 3.46. Also with hyponatremia. Getting hydrochlorothiazide as outpatient. On 03/09/17, Creatinine of 2.3, GFR of 26.   Patient speaks [place] ([name]) and is translated by son. Patient's main complaint is pain.   Patient had a syncopal episode about a month ago. Recent falls at home. Has been taking hydrochlorothiazide for blood pressure. Currently not checking blood pressure at home.   Went to see PCP on 11/13 where labs were done and he was started on vitamin D.     Medications: Outpatient medications: Medications Prior to Admission  Medication Sig Dispense Refill Last Dose  . famotidine (PEPCID) 40 MG tablet Take 40 mg by mouth daily.   unknown at unknown  . hydrochlorothiazide (HYDRODIURIL) 12.5 MG tablet Take 12.5 mg by mouth daily.   unknown at unknown  . montelukast (SINGULAIR) 10 MG tablet Take 10 mg by mouth daily.   unknown at unknown  . Vitamin D, Ergocalciferol, (DRISDOL) 50000 units CAPS capsule Take 50,000 Units by mouth every 7 (seven) days.   unknown at unknown    Current medications: Current Facility-Administered Medications  Medication Dose Route Frequency Provider Last Rate Last Dose  . 0.9 %  sodium chloride infusion   Intravenous Once Enid BaasKalisetti, Radhika, MD      . 0.9 %  sodium chloride  infusion   Intravenous Continuous Kailen Hinkle, MD 75 mL/hr at 03/27/17 1309    . acetaminophen (TYLENOL) tablet 650 mg  650 mg Oral Q4H PRN Poggi, Excell SeltzerJohn J, MD       Or  . acetaminophen (TYLENOL) suppository 650 mg  650 mg Rectal Q4H PRN Poggi, Excell SeltzerJohn J, MD      . bisacodyl (DULCOLAX) EC tablet 5 mg  5 mg Oral Daily PRN Katha HammingKonidena, Snehalatha, MD      . bisacodyl (DULCOLAX) suppository 10 mg  10 mg Rectal Daily PRN Poggi, Excell SeltzerJohn J, MD   10 mg at 03/27/17 1024  . diphenhydrAMINE (BENADRYL) 12.5 MG/5ML elixir 12.5-25 mg  12.5-25 mg Oral Q4H PRN Poggi, Excell SeltzerJohn J, MD      . docusate sodium (COLACE) capsule 100 mg  100 mg Oral BID Katha HammingKonidena, Snehalatha, MD   100 mg at 03/27/17 1024  . HYDROmorphone (DILAUDID) injection 0.5-1 mg  0.5-1 mg Intravenous Q2H PRN Poggi, Excell SeltzerJohn J, MD      . lactated ringers infusion   Intravenous Continuous Naomie DeanKephart, William K, MD      . metoCLOPramide (REGLAN) tablet 5-10 mg  5-10 mg Oral Q8H PRN Poggi, Excell SeltzerJohn J, MD  Or  . metoCLOPramide (REGLAN) injection 5-10 mg  5-10 mg Intravenous Q8H PRN Poggi, Excell Seltzer, MD      . montelukast (SINGULAIR) tablet 10 mg  10 mg Oral Daily Katha Hamming, MD   10 mg at 03/27/17 1024  . ondansetron (ZOFRAN) tablet 4 mg  4 mg Oral Q6H PRN Poggi, Excell Seltzer, MD       Or  . ondansetron (ZOFRAN) injection 4 mg  4 mg Intravenous Q6H PRN Poggi, Excell Seltzer, MD      . oxyCODONE (Oxy IR/ROXICODONE) immediate release tablet 10 mg  10 mg Oral Q3H PRN Poggi, Excell Seltzer, MD      . oxyCODONE (Oxy IR/ROXICODONE) immediate release tablet 5 mg  5 mg Oral Q3H PRN Poggi, Excell Seltzer, MD      . sodium phosphate (FLEET) 7-19 GM/118ML enema 1 enema  1 enema Rectal Once PRN Poggi, Excell Seltzer, MD      . Vitamin D (Ergocalciferol) (DRISDOL) capsule 50,000 Units  50,000 Units Oral Q7 days Katha Hamming, MD   50,000 Units at 03/26/17 1001      Allergies: No Known Allergies    Past Medical History: Past Medical History:  Diagnosis Date  . GERD (gastroesophageal reflux  disease)   . Hypertension      Past Surgical History: Past Surgical History:  Procedure Laterality Date  . INTRAMEDULLARY (IM) NAIL INTERTROCHANTERIC Right 03/25/2017   Procedure: INTRAMEDULLARY (IM) NAIL INTERTROCHANTRIC;  Surgeon: Christena Flake, MD;  Location: [facility] ORS;  Service: Orthopedics;  Laterality: Right;     Family History: No family history on file.   Social History: Social History   Socioeconomic History  . Marital status: Widowed    Spouse name: Not on file  . Number of children: Not on file  . Years of education: Not on file  . Highest education level: Not on file  Social Needs  . Financial resource strain: Not on file  . Food insecurity - worry: Not on file  . Food insecurity - inability: Not on file  . Transportation needs - medical: Not on file  . Transportation needs - non-medical: Not on file  Occupational History  . Not on file  Tobacco Use  . Smoking status: Former Games developer  . Smokeless tobacco: Never Used  Substance and Sexual Activity  . Alcohol use: No  . Drug use: No  . Sexual activity: Not on file  Other Topics Concern  . Not on file  Social History Narrative  . Not on file     Review of Systems: Review of Systems  Unable to perform ROS: Age    Vital Signs: Blood pressure 130/62, pulse 92, temperature 97.7 F (36.5 C), temperature source Oral, resp. rate 16, height 5' (1.524 m), weight 48.7 kg (107 lb 6.4 oz), SpO2 99 %.  Weight trends: Filed Weights   03/25/17 2038 03/26/17 0500 03/27/17 0611  Weight: 46.5 kg (102 lb 9.6 oz) 46.6 kg (102 lb 12.8 oz) 48.7 kg (107 lb 6.4 oz)    Physical Exam: General: NAD, laying in bed  Head: Normocephalic, atraumatic. Moist oral mucosal membranes  Eyes: Anicteric, PERRL  Neck: Supple, trachea midline  Lungs:  Clear to auscultation  Heart: Regular rate and rhythm  Abdomen:  Soft, nontender,   Extremities: no peripheral edema.  Neurologic: Nonfocal, moving all four extremities  Skin: No  lesions        Lab results: Basic Metabolic Panel: Recent Labs  Lab 03/25/17 1007 03/26/17 0253 03/27/17 0403  NA 127* 127* 128*  K 4.2 4.9 4.9  CL 89* 97* 101  CO2 26 19* 21*  GLUCOSE 145* 201* 118*  BUN 38* 46* 61*  CREATININE 2.17* 2.62* 3.46*  CALCIUM 9.1 7.4* 7.5*    Liver Function Tests: Recent Labs  Lab 03/25/17 1007  AST 38  ALT 28  ALKPHOS 62  BILITOT 1.6*  PROT 6.9  ALBUMIN 4.0   No results for input(s): LIPASE, AMYLASE in the last 168 hours. No results for input(s): AMMONIA in the last 168 hours.  CBC: Recent Labs  Lab 03/25/17 1007 03/26/17 0253 03/27/17 0403  WBC 8.0 5.9 10.6  NEUTROABS 6.8*  --   --   HGB 12.6* 8.2* 7.2*  HCT 39.5* 25.6* 22.3*  MCV 83.3 83.4 83.2  PLT 113* 78* 80*    Cardiac Enzymes: No results for input(s): CKTOTAL, CKMB, CKMBINDEX, TROPONINI in the last 168 hours.  BNP: Invalid input(s): POCBNP  CBG: Recent Labs  Lab 03/26/17 0759 03/27/17 0722  GLUCAP 182* 122*    Microbiology: No results found for this or any previous visit.  Coagulation Studies: Recent Labs    03/25/17 1007  LABPROT 14.8  INR 1.17    Urinalysis: No results for input(s): COLORURINE, LABSPEC, PHURINE, GLUCOSEU, HGBUR, BILIRUBINUR, KETONESUR, PROTEINUR, UROBILINOGEN, NITRITE, LEUKOCYTESUR in the last 72 hours.  Invalid input(s): APPERANCEUR    Imaging: Dg Hip Operative Unilat W Or W/o Pelvis Right  Result Date: 03/25/2017 CLINICAL DATA:  Right hip fracture. EXAM: OPERATIVE RIGHT HIP (WITH PELVIS IF PERFORMED) 7 VIEWS TECHNIQUE: Fluoroscopic spot image(s) were submitted for interpretation post-operatively. COMPARISON:  Radiographs of earlier today. FINDINGS: Intraoperative imaging demonstrates placement of intramedullary screw and nail fixation device. No acute hardware complication or interval fracture. IMPRESSION: Intraoperative imaging of proximal right femoral fixation. Electronically Signed   By: Jeronimo GreavesKyle  Talbot M.D.   On:  03/25/2017 19:58      Assessment & Plan: Mr. Michael Hoesen Maranto is a [age] y.o. Asian [place](Laotian) male with hypertension and GERD, who [facility] for Hyponatremia [E87.1] Closed fracture of right hip, initial encounter [facility](HCC) [S72.001A]  Hospital Course: Right hip surgery on 11/29 Dr. Joice LoftsPoggi  1. Acute Renal Failure on chronic kidney disease stage IV: baseline creatinine 2.3 GFR of 26 on 03/09/17 Hypotensive on admission. No IV contrast exposure.  On hydrochlorothiazide - Suspect prerenal azotemia and possible ATN.  - Discontinue diuretic - Start NS at 2450mL/hr for 24 hours.  - Place foley catheter - Check renal ultrasound - Check urine studies.  2. Hyponatremia: Na 128. Concerning for hydrochlorothiazide versus hypovolemia. Seems to be chronically in the low 130s but recently 128 as outpatient.  - Discontinue hydrochlorothiazide - NS as above.   3. Anemia: with chronic kidney disease: normocytic. Hemoglobin 7.2 - PRBC transfusion ordered.   LOS: 2 Kelley Polinsky 12/1/20182:49 PM

## 2017-03-27 NOTE — Progress Notes (Signed)
PT Cancellation Note  Patient Details Name: Michael Shelton MRN: [id number] DOB: 04/12/1914   Cancelled Treatment:    Reason Eval/Treat Not Completed: Medical issues which prohibited therapy;Patient at procedure or test/unavailable;Patient not medically ready. Chart reviewed, RN consulted. Holding pt treatment at this time due to MD recommendation for transfusion today to address acute on chronic anemia. Will attempt at later date/time.   1:05 PM, 03/27/17 Rosamaria LintsAllan C Buccola, PT, DPT Physical Therapist - [place] [phone](ASCOM)  [phone] (mobile)    Buccola,Allan C 03/27/2017, 1:04 PM

## 2017-03-27 NOTE — Care Management Note (Signed)
Case Management Note  Patient Details  Name: Michael Shelton MRN: [id number] Date of Birth: 02/03/1914  Subjective/Objective:     Barrier to discharge today: Hgb=7.2  Transfusing 1 unit PRBCs today.                Action/Plan:   Expected Discharge Date:                  Expected Discharge Plan:  Home w Home Health Services  In-House Referral:     Discharge planning Services  CM Consult  Post Acute Care Choice:  Durable Medical Equipment, Home Health Choice offered to:  Adult Children  DME Arranged:  Bedside commode, Walker rolling DME Agency:  [facility]  HH Arranged:  PT HH Agency:  [facility]  Status of Service:  In process, will continue to follow  If discussed at Long Length of Stay Meetings, dates discussed:    Additional Comments:  Donnalee Cellucci A, RN 03/27/2017, 2:17 PM

## 2017-03-27 NOTE — Progress Notes (Signed)
Physical Therapy Treatment Patient Details Name: Michael Shelton MRN: [id number] DOB: 04/18/1914 Today's Date: 03/27/2017    History of Present Illness Pt is a89 y.o.malewith a known history of hypertension, seasonal allergies had a fall this morning. Patient was sitting on a step stool about 10 inches off the floor and lost his balance and fell off on the right hip. Unable to get up and suffered a right intertrochanteric hip fracture. Patient had severe pain worse with movement. No other injuries reported.  Pt now s/p R hip IM nail.  Of note, pt speaks laotian but patient is hard of hearing and was not able to use the video interpreter.    PT Comments    Chart reviewed.  Discussed with primary RN due to Hemoglobin 7.2.  OK for supine exercises.  Participated in exercises as described below.  Pt responded well with slow gentle ROM RLE.  Son in and translated as needed.   Follow Up Recommendations  SNF     Equipment Recommendations  Other (comment)    Recommendations for Other Services       Precautions / Restrictions Precautions Precautions: Fall Restrictions Weight Bearing Restrictions: Yes RLE Weight Bearing: Weight bearing as tolerated    Mobility  Bed Mobility               General bed mobility comments: deferred this AM due to hemoglobin  Transfers                 General transfer comment: deferred this am  Ambulation/Gait                 Stairs            Wheelchair Mobility    Modified Rankin (Stroke Patients Only)       Balance                                            Cognition Arousal/Alertness: Awake/alert Behavior During Therapy: WFL for tasks assessed/performed Overall Cognitive Status: Difficult to assess                                        Exercises Other Exercises Other Exercises: Supine AAROM for BLE for ankle pumps, heel slides, SLR, ab/add and SAQ x 10    General Comments         Pertinent Vitals/Pain Pain Assessment: Faces Faces Pain Scale: Hurts even more Pain Location: R hip with ROM Pain Descriptors / Indicators: Grimacing Pain Intervention(s): Limited activity within patient's tolerance    Home Living                      Prior Function            PT Goals (current goals can now be found in the care plan section) Progress towards PT goals: Progressing toward goals    Frequency    BID      PT Plan Current plan remains appropriate    Co-evaluation              AM-PAC PT "6 Clicks" Daily Activity  Outcome Measure  Difficulty turning over in bed (including adjusting bedclothes, sheets and blankets)?: Unable Difficulty moving from lying on back to sitting on the side of the bed? :  Unable Difficulty sitting down on and standing up from a chair with arms (e.g., wheelchair, bedside commode, etc,.)?: Unable Help needed moving to and from a bed to chair (including a wheelchair)?: A Lot Help needed walking in hospital room?: Total Help needed climbing 3-5 steps with a railing? : Total 6 Click Score: 7    End of Session   Activity Tolerance: Patient limited by pain Patient left: in bed;with call bell/phone within reach;with SCD's reapplied;with bed alarm set;with family/visitor present Nurse Communication: Other (comment)       Time: 6295-2841: 0946-0955 PT Time Calculation (min) (ACUTE ONLY): 9 min  Charges:  $Therapeutic Exercise: 8-22 mins                    G Codes:       Danielle DessSarah Alliene Klugh, PTA 03/27/17, 10:01 AM

## 2017-03-27 NOTE — Progress Notes (Signed)
Prime doc paged waiting on callback. 

## 2017-03-27 NOTE — Discharge Instructions (Signed)
POSTERIOR TOTAL HIP REPLACEMENT POSTOPERATIVE DIRECTIONS ° °Hip Rehabilitation, Guidelines Following Surgery  °The results of a hip operation are greatly improved after range of motion and muscle strengthening exercises. Follow all safety measures which are given to protect your hip. If any of these exercises cause increased pain or swelling in your joint, decrease the amount until you are comfortable again. Then slowly increase the exercises. Call your caregiver if you have problems or questions.  ° °HOME CARE INSTRUCTIONS  °Remove items at home which could result in a fall. This includes throw rugs or furniture in walking pathways.  °· ICE to the affected hip every three hours for 30 minutes at a time and then as needed for pain and swelling.  Continue to use ice on the hip for pain and swelling from surgery. You may notice swelling that will progress down to the foot and ankle.  This is normal after surgery.  Elevate the leg when you are not up walking on it.   °· Continue to use the breathing machine which will help keep your temperature down.  It is common for your temperature to cycle up and down following surgery, especially at night when you are not up moving around and exerting yourself.  The breathing machine keeps your lungs expanded and your temperature down. ° °DIET °You may resume your previous home diet once your are discharged from the hospital. ° °DRESSING / WOUND CARE / SHOWERING °Keep the surgical dressing until follow up.  The dressing is water proof, so you can shower without any extra covering.  IF THE DRESSING FALLS OFF or the wound gets wet inside, change the dressing with sterile gauze.  Please use good hand washing techniques before changing the dressing.  Do not use any lotions or creams on the incision until instructed by your surgeon.   °You need to keep your dressing dry after discharge.   °Change the surgical dressing if needed with Physical Therapy and reapply a dry dressing each  time. ° ° ° °ACTIVITY °Walk with your walker as instructed. °Use walker as long as suggested by your caregivers. °Avoid periods of inactivity such as sitting longer than an hour when not asleep. This helps prevent blood clots.  °You may resume a sexual relationship in one month or when given the OK by your doctor.  °You may return to work once you are cleared by your doctor.  °Do not drive a car for 6 weeks or until released by you surgeon.  °Do not drive while taking narcotics. ° °WEIGHT BEARING °Weight bearing as tolerated with assist device (walker, cane, etc) as directed, use it as long as suggested by your surgeon or therapist, typically at least 4-6 weeks. ° °POSTOPERATIVE CONSTIPATION PROTOCOL °Constipation - defined medically as fewer than three stools per week and severe constipation as less than one stool per week. ° °One of the most common issues patients have following surgery is constipation.  Even if you have a regular bowel pattern at home, your normal regimen is likely to be disrupted due to multiple reasons following surgery.  Combination of anesthesia, postoperative narcotics, change in appetite and fluid intake all can affect your bowels.  In order to avoid complications following surgery, here are some recommendations in order to help you during your recovery period. ° °Colace (docusate) - Pick up an over-the-counter form of Colace or another stool softener and take twice a day as long as you are requiring postoperative pain medications.  Take with a   full glass of water daily.  If you experience loose stools or diarrhea, hold the colace until you stool forms back up.  If your symptoms do not get better within 1 week or if they get worse, check with your doctor. ° °Dulcolax (bisacodyl) - Pick up over-the-counter and take as directed by the product packaging as needed to assist with the movement of your bowels.  Take with a full glass of water.  Use this product as needed if not relieved by Colace  only.  ° °MiraLax (polyethylene glycol) - Pick up over-the-counter to have on hand.  MiraLax is a solution that will increase the amount of water in your bowels to assist with bowel movements.  Take as directed and can mix with a glass of water, juice, soda, coffee, or tea.  Take if you go more than two days without a movement. °Do not use MiraLax more than once per day. Call your doctor if you are still constipated or irregular after using this medication for 7 days in a row. ° °If you continue to have problems with postoperative constipation, please contact the office for further assistance and recommendations.  If you experience "the worst abdominal pain ever" or develop nausea or vomiting, please contact the office immediatly for further recommendations for treatment. ° °ITCHING ° If you experience itching with your medications, try taking only a single pain pill, or even half a pain pill at a time.  You can also use Benadryl over the counter for itching or also to help with sleep.  ° °TED HOSE STOCKINGS °Wear the elastic stockings on both legs for three weeks following surgery during the day but you may remove then at night for sleeping. ° °MEDICATIONS °See your medication summary on the “After Visit Summary” that the nursing staff will review with you prior to discharge.  You may have some home medications which will be placed on hold until you complete the course of blood thinner medication.  It is important for you to complete the blood thinner medication as prescribed by your surgeon.  Continue your approved medications as instructed at time of discharge. ° °PRECAUTIONS °If you experience chest pain or shortness of breath - call 911 immediately for transfer to the hospital emergency department.  °If you develop a fever greater that 101 F, purulent drainage from wound, increased redness or drainage from wound, foul odor from the wound/dressing, or calf pain - CONTACT YOUR SURGEON.   °                                                 °FOLLOW-UP APPOINTMENTS °Make sure you keep all of your appointments after your operation with your surgeon and caregivers. You should call the office at the above phone number and make an appointment for approximately two weeks after the date of your surgery or on the date instructed by your surgeon outlined in the "After Visit Summary". ° °RANGE OF MOTION AND STRENGTHENING EXERCISES  °These exercises are designed to help you keep full movement of your hip joint. Follow your caregiver's or physical therapist's instructions. Perform all exercises about fifteen times, three times per day or as directed. Exercise both hips, even if you have had only one joint replacement. These exercises can be done on a training (exercise) mat, on the floor, on a table or on a   bed. Use whatever works the best and is most comfortable for you. Use music or television while you are exercising so that the exercises are a pleasant break in your day. This will make your life better with the exercises acting as a break in routine you can look forward to.  °Lying on your back, slowly slide your foot toward your buttocks, raising your knee up off the floor. Then slowly slide your foot back down until your leg is straight again.  °Lying on your back spread your legs as far apart as you can without causing discomfort.  °Lying on your side, raise your upper leg and foot straight up from the floor as far as is comfortable. Slowly lower the leg and repeat.  °Lying on your back, tighten up the muscle in the front of your thigh (quadriceps muscles). You can do this by keeping your leg straight and trying to raise your heel off the floor. This helps strengthen the largest muscle supporting your knee.  °Lying on your back, tighten up the muscles of your buttocks both with the legs straight and with the knee bent at a comfortable angle while keeping your heel on the floor.  ° ° ° ° °IF YOU ARE TRANSFERRED TO A SKILLED REHAB  FACILITY °If the patient is transferred to a skilled rehab facility following release from the hospital, a list of the current medications will be sent to the facility for the patient to continue.  When discharged from the skilled rehab facility, please have the facility set up the patient's Home Health Physical Therapy prior to being released. Also, the skilled facility will be responsible for providing the patient with their medications at time of release from the facility to include their pain medication, the muscle relaxants, and their blood thinner medication. If the patient is still at the rehab facility at time of the two week follow up appointment, the skilled rehab facility will also need to assist the patient in arranging follow up appointment in our office and any transportation needs. ° °MAKE SURE YOU:  °Understand these instructions.  °Get help right away if you are not doing well or get worse.  ° ° °Pick up stool softner and laxative for home use following surgery while on pain medications. °Do not submerge incision under water. °Please use good hand washing techniques while changing dressing each day. °May shower starting three days after surgery. °Please use a clean towel to pat the incision dry following showers. °Continue to use ice for pain and swelling after surgery. °Do not use any lotions or creams on the incision until instructed by your surgeon. °

## 2017-03-27 NOTE — Progress Notes (Signed)
  Subjective: 2 Days Post-Op Procedure(s) (LRB): INTRAMEDULLARY (IM) NAIL INTERTROCHANTRIC (Right) Patient reports pain as mild.   Patient is well, and has had no acute complaints or problems, patient does not speak english so the son provides interpretation. PT and Care management to assist with discharge. Negative for chest pain and shortness of breath Fever: no Gastrointestinal:Negative for nausea and vomiting  Objective: Vital signs in last 24 hours: Temp:  [97.7 F (36.5 C)-98.6 F (37 C)] 97.7 F (36.5 C) (12/01 0611) Pulse Rate:  [73-135] 135 (12/01 0611) Resp:  [16-18] 16 (12/01 0611) BP: (94-128)/(53-60) 128/55 (12/01 0611) SpO2:  [94 %-100 %] 100 % (12/01 0611) Weight:  [48.7 kg (107 lb 6.4 oz)] 48.7 kg (107 lb 6.4 oz) (12/01 0611)  Intake/Output from previous day:  Intake/Output Summary (Last 24 hours) at 03/27/2017 0648 Last data filed at 03/26/2017 1140 Gross per 24 hour  Intake 2155 ml  Output -  Net 2155 ml    Intake/Output this shift: No intake/output data recorded.  Labs: Recent Labs    03/25/17 1007 03/26/17 0253 03/27/17 0403  HGB 12.6* 8.2* 7.2*   Recent Labs    03/26/17 0253 03/27/17 0403  WBC 5.9 10.6  RBC 3.06* 2.68*  HCT 25.6* 22.3*  PLT 78* 80*   Recent Labs    03/26/17 0253 03/27/17 0403  NA 127* 128*  K 4.9 4.9  CL 97* 101  CO2 19* 21*  BUN 46* 61*  CREATININE 2.62* 3.46*  GLUCOSE 201* 118*  CALCIUM 7.4* 7.5*   Recent Labs    03/25/17 1007  INR 1.17     EXAM General - Patient is Alert, Appropriate and Oriented Extremity - ABD soft Intact pulses distally Dorsiflexion/Plantar flexion intact Incision: moderate drainage No cellulitis present Dressing/Incision - blood tinged drainage Motor Function - intact, moving foot and toes well on exam.   Abdomen soft with palpation, normal BS.  Past Medical History:  Diagnosis Date  . GERD (gastroesophageal reflux disease)   . Hypertension     Assessment/Plan: 2 Days  Post-Op Procedure(s) (LRB): INTRAMEDULLARY (IM) NAIL INTERTROCHANTRIC (Right) Active Problems:   Closed right hip fracture, initial encounter (HCC)  Estimated body mass index is 20.98 kg/m as calculated from the following:   Height as of this encounter: 5' (1.524 m).   Weight as of this encounter: 48.7 kg (107 lb 6.4 oz). Advance diet Up with therapy D/C IV fluids when tolerating po intake.  Labs reviewed, hemoglobin 7.2, and will receive transfused blood. Up with therapy today. Discharge planning.  DVT Prophylaxis - Lovenox, Foot Pumps and TED hose Weight-Bearing as tolerated to right leg  Dedra Skeensodd Kayman Snuffer, PA-C [facility] 03/27/2017, 6:48 AM

## 2017-03-27 NOTE — Progress Notes (Signed)
[facility] [place] [facility]   PATIENT NAME: Michael Shelton    MR#:  [id number]  DATE OF BIRTH:  07/21/1913  SUBJECTIVE:  CHIEF COMPLAINT:   Chief Complaint  Patient presents with  . Fall   -  POD # 2 after right hip intertrochanteric intramedullary nail placement. Speaks Laotian. Son at bedside for interpretation. - renal function worsening, also hb dropped  REVIEW OF SYSTEMS:  Review of Systems  Constitutional: Negative for chills and fever.  HENT: Positive for hearing loss. Negative for congestion, ear discharge and nosebleeds.   Eyes: Negative for blurred vision and double vision.  Respiratory: Negative for cough, shortness of breath and wheezing.   Cardiovascular: Negative for chest pain, palpitations and leg swelling.  Gastrointestinal: Negative for abdominal pain, constipation, diarrhea, nausea and vomiting.  Genitourinary: Negative for dysuria.  Musculoskeletal: Positive for myalgias.  Neurological: Positive for weakness. Negative for dizziness, speech change, focal weakness, seizures and headaches.  Psychiatric/Behavioral: Negative for depression.    DRUG ALLERGIES:  No Known Allergies  VITALS:  Blood pressure 130/62, pulse 92, temperature 97.7 F (36.5 C), temperature source Oral, resp. rate 16, height 5' (1.524 m), weight 48.7 kg (107 lb 6.4 oz), SpO2 99 %.  PHYSICAL EXAMINATION:  Physical Exam  GENERAL:  [age] y.o.-year-old elderly patient lying in the bed with no acute distress.  EYES: Pupils equal, round, reactive to light and accommodation. No scleral icterus. Extraocular muscles intact.  HEENT: Head atraumatic, normocephalic. Oropharynx and nasopharynx clear.  NECK:  Supple, no jugular venous distention. No thyroid enlargement, no tenderness.  LUNGS: Normal breath sounds bilaterally, no wheezing, rales,rhonchi or crepitation. No use of accessory muscles of respiration. Decreased at the bases CARDIOVASCULAR: S1, S2 normal. No murmurs, rubs,  or gallops.  ABDOMEN: Soft, nontender, nondistended. Bowel sounds present. No organomegaly or mass.  EXTREMITIES: No pedal edema, cyanosis, or clubbing. Right hip dressing in place with minimal swelling. NEUROLOGIC: Cranial nerves II through XII are intact. Muscle strength 5/5 in all extremities except right leg due to pain limiting mobility. Sensation intact. Gait not checked. Following simple commands PSYCHIATRIC: The patient is alert and oriented.  SKIN: No obvious rash, lesion, or ulcer.    LABORATORY PANEL:   CBC Recent Labs  Lab 03/27/17 0403  WBC 10.6  HGB 7.2*  HCT 22.3*  PLT 80*   ------------------------------------------------------------------------------------------------------------------  Chemistries  Recent Labs  Lab 03/25/17 1007  03/27/17 0403  NA 127*   < > 128*  K 4.2   < > 4.9  CL 89*   < > 101  CO2 26   < > 21*  GLUCOSE 145*   < > 118*  BUN 38*   < > 61*  CREATININE 2.17*   < > 3.46*  CALCIUM 9.1   < > 7.5*  AST 38  --   --   ALT 28  --   --   ALKPHOS 62  --   --   BILITOT 1.6*  --   --    < > = values in this interval not displayed.   ------------------------------------------------------------------------------------------------------------------  Cardiac Enzymes No results for input(s): TROPONINI in the last 168 hours. ------------------------------------------------------------------------------------------------------------------  RADIOLOGY:  Dg Hip Operative Unilat W Or W/o Pelvis Right  Result Date: 03/25/2017 CLINICAL DATA:  Right hip fracture. EXAM: OPERATIVE RIGHT HIP (WITH PELVIS IF PERFORMED) 7 VIEWS TECHNIQUE: Fluoroscopic spot image(s) were submitted for interpretation post-operatively. COMPARISON:  Radiographs of earlier today. FINDINGS: Intraoperative imaging demonstrates placement of intramedullary  screw and nail fixation device. No acute hardware complication or interval fracture. IMPRESSION: Intraoperative imaging of proximal  right femoral fixation. Electronically Signed   By: Jeronimo GreavesKyle  Talbot M.D.   On: 03/25/2017 19:58    EKG:   Orders placed or performed during the hospital encounter of 03/25/17  . ED EKG  . ED EKG  . EKG 12-Lead  . EKG 12-Lead    ASSESSMENT AND PLAN:     #1 right hip fracture-status post intramedullary nail placement and intertrochanteric region. -Postoperative day 2 today. Continue physical therapy. Pain control as tolerated. -Avoid too strong medications to prevent delirium. Ice pack to the surgical site for swelling and pain -We'll check urinalysis to rule out any infection  #2 acute on chronic anemia- post op blood loss anemia - hb dropped from 12 to 7 - order 1 unit prbc Tx today - monitor  #3 acute renal failure on CK D stage III. Baseline creatinine seems to be around 2 -received gentle hydration.  - now worse at 3.46. Nephrology consulted - foley for strict input and output  #4 DVT prophylaxis-on subcutaneous heparin- hold due to anemia  Continue physical therapy- will need rehab at discharge   All the records are reviewed and case discussed with Care Management/Social Workerr. Management plans discussed with the patient, family and they are in agreement.  CODE STATUS: Full code  TOTAL TIME TAKING CARE OF THIS PATIENT: 38 minutes.   POSSIBLE D/C IN 2-3 DAYS, DEPENDING ON CLINICAL CONDITION.   Michael Shelton M.D on 03/27/2017 at 12:12 PM  Between 7am to 6pm - Pager - [phone]  After 6pm go to [email or web address] - Social research officer, governmentpassword [email or web address] [email or web address]  [facility]  Office  [phone]  CC: Primary care physician; Michael RudBabaoff, Marcus, MD

## 2017-03-27 NOTE — Progress Notes (Signed)
Pt alert, son at bedside. No complaints of pain. Old drainage to Surgical dressing. Iv infusing without difficulty. Pt incontinent of urine this shift. Pt able to sleep in between care.

## 2017-03-27 NOTE — Progress Notes (Signed)
Spoke with Dr. Sheryle Hailiamond hemoglobin at 7.2 and heart rate elevated at 135. MD to place orders.

## 2017-03-28 LAB — RENAL FUNCTION PANEL
Albumin: 2.8 g/dL — ABNORMAL LOW (ref 3.5–5.0)
Anion gap: 6 (ref 5–15)
BUN: 58 mg/dL — ABNORMAL HIGH (ref 6–20)
CHLORIDE: 107 mmol/L (ref 101–111)
CO2: 19 mmol/L — AB (ref 22–32)
Calcium: 7.6 mg/dL — ABNORMAL LOW (ref 8.9–10.3)
Creatinine, Ser: 2.65 mg/dL — ABNORMAL HIGH (ref 0.61–1.24)
GFR, EST AFRICAN AMERICAN: 21 mL/min — AB (ref >60)
GFR, EST NON AFRICAN AMERICAN: 18 mL/min — AB (ref >60)
Glucose, Bld: 103 mg/dL — ABNORMAL HIGH (ref 65–99)
POTASSIUM: 4 mmol/L (ref 3.5–5.1)
Phosphorus: 3.4 mg/dL (ref 2.5–4.6)
Sodium: 132 mmol/L — ABNORMAL LOW (ref 135–145)

## 2017-03-28 LAB — BPAM RBC
Blood Product Expiration Date: 201812122359
ISSUE DATE / TIME: 201812011600
Unit Type and Rh: 5100

## 2017-03-28 LAB — GLUCOSE, CAPILLARY: Glucose-Capillary: 105 mg/dL — ABNORMAL HIGH (ref 65–99)

## 2017-03-28 LAB — CBC
HEMATOCRIT: 24.5 % — AB (ref 40.0–52.0)
HEMOGLOBIN: 8 g/dL — AB (ref 13.0–18.0)
MCH: 27.5 pg (ref 26.0–34.0)
MCHC: 32.7 g/dL (ref 32.0–36.0)
MCV: 84.1 fL (ref 80.0–100.0)
PLATELETS: 64 10*3/uL — AB (ref 150–440)
RBC: 2.91 MIL/uL — AB (ref 4.40–5.90)
RDW: 14.6 % — ABNORMAL HIGH (ref 11.5–14.5)
WBC: 10.4 10*3/uL (ref 3.8–10.6)

## 2017-03-28 LAB — TYPE AND SCREEN
ABO/RH(D): O POS
ANTIBODY SCREEN: NEGATIVE
UNIT DIVISION: 0

## 2017-03-28 MED ORDER — SODIUM CHLORIDE 0.9 % IV SOLN
INTRAVENOUS | Status: DC
  Administered 2017-03-28: 09:00:00 via INTRAVENOUS
  Administered 2017-03-29: 50 mL/h via INTRAVENOUS

## 2017-03-28 MED ORDER — FERROUS SULFATE 325 (65 FE) MG PO TABS
325.0000 mg | ORAL_TABLET | Freq: Two times a day (BID) | ORAL | Status: DC
  Administered 2017-03-28 – 2017-03-29 (×3): 325 mg via ORAL
  Filled 2017-03-28 (×3): qty 1

## 2017-03-28 NOTE — Progress Notes (Signed)
[facility] - [place] [facility]   PATIENT NAME: Michael Shelton    MR#:  [id number]  DATE OF BIRTH:  12/16/1913  SUBJECTIVE:  CHIEF COMPLAINT:   Chief Complaint  Patient presents with  . Fall   -  POD # 3 after right hip intertrochanteric intramedullary nail placement. Speaks Laotian. Son at bedside for interpretation. - renal function slowly improving. Patient complains of right first toenail pain -Received packed RBC transfusion yesterday  REVIEW OF SYSTEMS:  Review of Systems  Constitutional: Negative for chills and fever.  HENT: Positive for hearing loss. Negative for congestion, ear discharge and nosebleeds.   Eyes: Negative for blurred vision and double vision.  Respiratory: Negative for cough, shortness of breath and wheezing.   Cardiovascular: Negative for chest pain, palpitations and leg swelling.  Gastrointestinal: Negative for abdominal pain, constipation, diarrhea, nausea and vomiting.  Genitourinary: Negative for dysuria.  Musculoskeletal: Positive for myalgias.  Neurological: Positive for weakness. Negative for dizziness, speech change, focal weakness, seizures and headaches.  Psychiatric/Behavioral: Negative for depression.    DRUG ALLERGIES:  No Known Allergies  VITALS:  Blood pressure (!) 125/49, pulse 72, temperature 99 F (37.2 C), temperature source Oral, resp. rate 18, height 5' (1.524 m), weight 49.8 kg (109 lb 11.2 oz), SpO2 100 %.  PHYSICAL EXAMINATION:  Physical Exam  GENERAL:  [age] y.o.-year-old elderly patient lying in the bed with no acute distress.  EYES: Pupils equal, round, reactive to light and accommodation. No scleral icterus. Extraocular muscles intact.  HEENT: Head atraumatic, normocephalic. Oropharynx and nasopharynx clear.  NECK:  Supple, no jugular venous distention. No thyroid enlargement, no tenderness.  LUNGS: Normal breath sounds bilaterally, no wheezing, rales,rhonchi or crepitation. No use of accessory muscles of  respiration. Decreased at the bases CARDIOVASCULAR: S1, S2 normal. No murmurs, rubs, or gallops.  ABDOMEN: Soft, nontender, nondistended. Bowel sounds present. No organomegaly or mass.  EXTREMITIES: No pedal edema, cyanosis, or clubbing. Right hip dressing in place with some swelling of the right thigh. Right foot examined, tenderness at the tip of toenail could being grown toenail. No redness or swelling of the joints noted. NEUROLOGIC: Cranial nerves II through XII are intact. Muscle strength 5/5 in all extremities except right leg due to pain limiting mobility. Sensation intact. Gait not checked. Following simple commands PSYCHIATRIC: The patient is alert and oriented.  SKIN: No obvious rash, lesion, or ulcer.    LABORATORY PANEL:   CBC Recent Labs  Lab 03/28/17 0309  WBC 10.4  HGB 8.0*  HCT 24.5*  PLT 64*   ------------------------------------------------------------------------------------------------------------------  Chemistries  Recent Labs  Lab 03/25/17 1007  03/28/17 0309  NA 127*   < > 132*  K 4.2   < > 4.0  CL 89*   < > 107  CO2 26   < > 19*  GLUCOSE 145*   < > 103*  BUN 38*   < > 58*  CREATININE 2.17*   < > 2.65*  CALCIUM 9.1   < > 7.6*  AST 38  --   --   ALT 28  --   --   ALKPHOS 62  --   --   BILITOT 1.6*  --   --    < > = values in this interval not displayed.   ------------------------------------------------------------------------------------------------------------------  Cardiac Enzymes No results for input(s): TROPONINI in the last 168 hours. ------------------------------------------------------------------------------------------------------------------  RADIOLOGY:  [place] Renal  Result Date: 03/27/2017 CLINICAL DATA:  Acute renal failure EXAM: RENAL /  URINARY TRACT ULTRASOUND COMPLETE COMPARISON:  CT scan October 05, 2015 FINDINGS: Right Kidney: Length: 8.6 cm.  Multiple cysts with the largest measuring 6.2 cm. Left Kidney: Length: 9.5 cm.   Multiple cysts.  The largest measures 2.8 cm. Bladder: Decompressed with a Foley catheter. IMPRESSION: Renal cysts.  No cause for acute renal failure. Electronically Signed   By: Gerome Samavid  Williams III M.D   On: 03/27/2017 14:54    EKG:   Orders placed or performed during the hospital encounter of 03/25/17  . ED EKG  . ED EKG  . EKG 12-Lead  . EKG 12-Lead    ASSESSMENT AND PLAN:   83 year old male with past medical history significant for hypertension, GERD presents to hospital after a fall and noted to have right intertrochanteric hip fracture.   #1 right hip fracture-status post intramedullary nail placement and intertrochanteric region. -Postoperative day 3 today. Continue physical therapy. Pain control as tolerated. -Avoid too strong medications to prevent delirium. Ice pack to the surgical site for swelling and pain -urine analysis with only rare bacteria and few WBCs noted. -physical therapy recommended rehabilitation, however family is interested in home  #2 acute on chronic anemia- post op blood loss anemia - hb dropped from 12 to 7 - received 1 unit packed RBC transfusion yesterday and hemoglobin is at 8. Monitor closely. No indication for transfusion unless less than 7.  #3 acute renal failure on CK D stage III. Baseline creatinine seems to be around 2 -received gentle hydration.  - creatinine slowly improving with fluids. Hydrochlorothiazide has been discontinued.Sodium is also improving. -Appreciate nephrology consult.  #4 DVT prophylaxis-on subcutaneous heparin- hold due to anemia Continue Ted's and SCDs  Continue physical therapy- recommended rehab at discharge, but family interested in taking him home   All the records are reviewed and case discussed with Care Management/Social Workerr. Management plans discussed with the patient, family and they are in agreement.  CODE STATUS: Full code  TOTAL TIME TAKING CARE OF THIS PATIENT: 37 minutes.   POSSIBLE D/C   tomorrow, DEPENDING ON CLINICAL CONDITION.   Enid BaasKALISETTI,Ceferino Lang M.D on 03/28/2017 at 10:47 AM  Between 7am to 6pm - Pager - [phone]  After 6pm go to [email or web address] - Social research officer, governmentpassword EPAS [facility]  [facility]  Office  [phone]  CC: Primary care physician; Kandyce RudBabaoff, Marcus, MD

## 2017-03-28 NOTE — Progress Notes (Signed)
[facility]  ROUNDING NOTE   Subjective:   Daughter-in-law interprets for patient.   UOP 800  NS at 6850mL/hr  Creatinine 2.65 (3.46)  Na 132 (128)  PRBC transfusion yesterday  Objective:  Vital signs in last 24 hours:  Temp:  [97.8 F (36.6 C)-99 F (37.2 C)] 99 F (37.2 C) (12/02 0832) Pulse Rate:  [68-112] 72 (12/02 0832) Resp:  [18] 18 (12/02 0832) BP: (122-144)/(49-67) 125/49 (12/02 0832) SpO2:  [98 %-100 %] 100 % (12/02 0832) Weight:  [49.8 kg (109 lb 11.2 oz)] 49.8 kg (109 lb 11.2 oz) (12/02 0500)  Weight change: 1.043 kg (2 lb 4.8 oz) Filed Weights   03/26/17 0500 03/27/17 0611 03/28/17 0500  Weight: 46.6 kg (102 lb 12.8 oz) 48.7 kg (107 lb 6.4 oz) 49.8 kg (109 lb 11.2 oz)    Intake/Output: I/O last 3 completed shifts: In: 1051.1 [P.O.:480; I.V.:241.3; Blood:329.8] Out: 800 [Urine:800]   Intake/Output this shift:  Total I/O In: 360 [P.O.:360] Out: -   Physical Exam: General: NAD, laying in bed, frail  Head: Normocephalic, atraumatic. Moist oral mucosal membranes  Eyes: Anicteric, PERRL  Neck: Supple, trachea midline  Lungs:  Clear to auscultation  Heart: Regular rate and rhythm  Abdomen:  Soft, nontender,   Extremities: no peripheral edema.  Neurologic: Nonfocal, moving all four extremities, Alert times three  Skin: No lesions       Basic Metabolic Panel: Recent Labs  Lab 03/25/17 1007 03/26/17 0253 03/27/17 0403 03/28/17 0309  NA 127* 127* 128* 132*  K 4.2 4.9 4.9 4.0  CL 89* 97* 101 107  CO2 26 19* 21* 19*  GLUCOSE 145* 201* 118* 103*  BUN 38* 46* 61* 58*  CREATININE 2.17* 2.62* 3.46* 2.65*  CALCIUM 9.1 7.4* 7.5* 7.6*  PHOS  --   --   --  3.4    Liver Function Tests: Recent Labs  Lab 03/25/17 1007 03/28/17 0309  AST 38  --   ALT 28  --   ALKPHOS 62  --   BILITOT 1.6*  --   PROT 6.9  --   ALBUMIN 4.0 2.8*   No results for input(s): LIPASE, AMYLASE in the last 168 hours. No results for input(s): AMMONIA in  the last 168 hours.  CBC: Recent Labs  Lab 03/25/17 1007 03/26/17 0253 03/27/17 0403 03/27/17 1950 03/28/17 0309  WBC 8.0 5.9 10.6  --  10.4  NEUTROABS 6.8*  --   --   --   --   HGB 12.6* 8.2* 7.2* 8.2* 8.0*  HCT 39.5* 25.6* 22.3* 24.9* 24.5*  MCV 83.3 83.4 83.2  --  84.1  PLT 113* 78* 80*  --  64*    Cardiac Enzymes: No results for input(s): CKTOTAL, CKMB, CKMBINDEX, TROPONINI in the last 168 hours.  BNP: Invalid input(s): POCBNP  CBG: Recent Labs  Lab 03/26/17 0759 03/27/17 0722 03/28/17 0723  GLUCAP 182* 122* 105*    Microbiology: No results found for this or any previous visit.  Coagulation Studies: No results for input(s): LABPROT, INR in the last 72 hours.  Urinalysis: Recent Labs    03/26/17 1613  COLORURINE YELLOW*  LABSPEC 1.014  PHURINE 5.0  GLUCOSEU NEGATIVE  HGBUR MODERATE*  BILIRUBINUR NEGATIVE  KETONESUR NEGATIVE  PROTEINUR NEGATIVE  NITRITE NEGATIVE  LEUKOCYTESUR NEGATIVE      Imaging: [place]as Renal  Result Date: 03/27/2017 CLINICAL DATA:  Acute renal failure EXAM: RENAL / URINARY TRACT ULTRASOUND COMPLETE COMPARISON:  CT scan October 05, 2015 FINDINGS:  Right Kidney: Length: 8.6 cm.  Multiple cysts with the largest measuring 6.2 cm. Left Kidney: Length: 9.5 cm.  Multiple cysts.  The largest measures 2.8 cm. Bladder: Decompressed with a Foley catheter. IMPRESSION: Renal cysts.  No cause for acute renal failure. Electronically Signed   By: Gerome Samavid  Williams III M.D   On: 03/27/2017 14:54     Medications:   . sodium chloride 50 mL/hr at 03/28/17 [id number]   . docusate sodium  100 mg Oral BID  . ferrous sulfate  325 mg Oral BID WC  . montelukast  10 mg Oral Daily  . Vitamin D (Ergocalciferol)  50,000 Units Oral Q7 days   acetaminophen **OR** acetaminophen, bisacodyl, bisacodyl, diphenhydrAMINE, HYDROmorphone (DILAUDID) injection, metoCLOPramide **OR** metoCLOPramide (REGLAN) injection, ondansetron **OR** ondansetron (ZOFRAN) IV, oxyCODONE,  oxyCODONE, sodium phosphate  Assessment/ Plan:  Mr. Dewitt Hoesen Olberding is a [age] y.o.  male Mr. Ki Maisie FusLor is a [age] y.o. Asian [place](Laotian) male with hypertension and GERD, who [facility] 03[facility]2017 for Hyponatremia [E87.1] Closed fracture of right hip, initial encounter [facility](HCC) [S72.001A]  Hospital Course: Right hip surgery on 11/29 Dr. Joice LoftsPoggi  1. Acute Renal Failure on chronic kidney disease stage IV: baseline creatinine 2.3 GFR of 26 on 03/09/17 Hypotensive on admission. No IV contrast exposure. Ultrasound with no obstruction Suspect prerenal azotemia and possible ATN.  - Discontinuef diuretic: hydrochlorothiazide.  - Continue NS at 3650mL/hr    2. Hyponatremia: Na 132. Concerning for hydrochlorothiazide and hypovolemia. Seems to be chronically in the low 130s but recently 128 as outpatient.  - Discontinued hydrochlorothiazide - NS IVF as above.   3. Anemia: with chronic kidney disease: normocytic. Hemoglobin 8. PRBC transfusion on 12/1 - PRBC transfusion ordered.      LOS: 3 Esley Brooking 12/2/201812:04 PM

## 2017-03-28 NOTE — Progress Notes (Signed)
  Subjective: 3 Days Post-Op Procedure(s) (LRB): INTRAMEDULLARY (IM) NAIL INTERTROCHANTRIC (Right) Patient reports pain as mild.   Patient is well, and has had no acute complaints or problems, patient does not speak english so the son provides interpretation. Complaining of some right first toe pain. PT and Care management to assist with discharge. Negative for chest pain and shortness of breath Fever: no Gastrointestinal:Negative for nausea and vomiting  Objective: Vital signs in last 24 hours: Temp:  [97.7 F (36.5 C)-99 F (37.2 C)] 97.8 F (36.6 C) (12/01 2147) Pulse Rate:  [68-112] 75 (12/01 2147) Resp:  [16-18] 18 (12/01 2147) BP: (122-144)/(53-67) 135/67 (12/01 2147) SpO2:  [98 %-100 %] 100 % (12/01 2147)  Intake/Output from previous day:  Intake/Output Summary (Last 24 hours) at 03/28/2017 0640 Last data filed at 03/28/2017 0104 Gross per 24 hour  Intake 1051.05 ml  Output 800 ml  Net 251.05 ml    Intake/Output this shift: Total I/O In: -  Out: 350 [Urine:350]  Labs: Recent Labs    03/25/17 1007 03/26/17 0253 03/27/17 0403 03/27/17 1950 03/28/17 0309  HGB 12.6* 8.2* 7.2* 8.2* 8.0*   Recent Labs    03/27/17 0403 03/27/17 1950 03/28/17 0309  WBC 10.6  --  10.4  RBC 2.68*  --  2.91*  HCT 22.3* 24.9* 24.5*  PLT 80*  --  64*   Recent Labs    03/27/17 0403 03/28/17 0309  NA 128* 132*  K 4.9 4.0  CL 101 107  CO2 21* 19*  BUN 61* 58*  CREATININE 3.46* 2.65*  GLUCOSE 118* 103*  CALCIUM 7.5* 7.6*   Recent Labs    03/25/17 1007  INR 1.17     EXAM General - Patient is Alert, Appropriate and Oriented Extremity - ABD soft Intact pulses distally Dorsiflexion/Plantar flexion intact Incision: moderate drainage No cellulitis present Dressing/Incision - blood tinged mild drainage Motor Function - intact, moving foot and toes well on exam.   Abdomen soft with palpation, normal BS.  Past Medical History:  Diagnosis Date  . GERD  (gastroesophageal reflux disease)   . Hypertension     Assessment/Plan: 3 Days Post-Op Procedure(s) (LRB): INTRAMEDULLARY (IM) NAIL INTERTROCHANTRIC (Right) Active Problems:   Closed right hip fracture, initial encounter (HCC)  Estimated body mass index is 20.98 kg/m as calculated from the following:   Height as of this encounter: 5' (1.524 m).   Weight as of this encounter: 48.7 kg (107 lb 6.4 oz). Advance diet Up with therapy D/C IV fluids when tolerating po intake.  Labs reviewed, hemoglobin 8.0. Up with therapy today. Discharge planning. Probable today.  DVT Prophylaxis - Lovenox, Foot Pumps and TED hose Weight-Bearing as tolerated to right leg  Michael Skeensodd Michaelangelo Mittelman, PA-C [facility] 03/28/2017, 6:40 AM

## 2017-03-28 NOTE — Progress Notes (Signed)
Physical Therapy Treatment Patient Details Name: Michael Shelton MRN: [id number] DOB: 10/05/1913 Today's Date: 03/28/2017    History of Present Illness Pt is a89 y.o.malewith a known history of hypertension, seasonal allergies had a fall this morning. Patient was sitting on a step stool about 10 inches off the floor and lost his balance and fell off on the right hip. Unable to get up and suffered a right intertrochanteric hip fracture. Patient had severe pain worse with movement. No other injuries reported.  Pt now s/p R hip IM nail.  Of note, pt speaks laotian but patient is hard of hearing and was not able to use the video interpreter.    PT Comments    Pt inititally hesitant to work with PT today d/t elevated pain levels, reporting 8/10 and saying he did not want to walk. Pt agreeable to gentle AA/ROM bed exercises, range limited d/t pain. BP established at supine and then pt mobilized to EOB maxA+2 for BP again, showing systolic drop 15mmHg: pt does not tolerate standing long enough for additional ortho vitals. Pt agreeable to SPT to chair, adjustments made to new pedRW to allow for improved elbow angle and ergonomics, still requires heavy physical assist for STS and pivot with RW. Family utilized for interpreter services d/t HOH, but require heavy verbal cuing to directly ask patient questions. Consider use of in-person or phone interpreter next session with volume accommodations d/t HOH.   Follow Up Recommendations        Equipment Recommendations       Recommendations for Other Services       Precautions / Restrictions Precautions Precautions: Fall Restrictions Weight Bearing Restrictions: Yes RLE Weight Bearing: Weight bearing as tolerated    Mobility  Bed Mobility Overal bed mobility: Needs Assistance Bed Mobility: Supine to Sit     Supine to sit: Max assist;+2 for physical assistance     General bed mobility comments: management of trunk and RLE   Transfers Overall  transfer level: Needs assistance Equipment used: (pediatric RW ) Transfers: Sit to/from Stand Sit to Stand: Mod assist;From elevated surface         General transfer comment: performed twice, improved with correct RW height  Ambulation/Gait   Ambulation Distance (Feet): (bed to chair, SPT)             Stairs            Wheelchair Mobility    Modified Rankin (Stroke Patients Only)       Balance           Standing balance support: During functional activity;Bilateral upper extremity supported Standing balance-Leahy Scale: Zero                              Cognition Arousal/Alertness: Awake/alert Behavior During Therapy: WFL for tasks assessed/performed Overall Cognitive Status: Difficult to assess                                        Exercises General Exercises - Lower Extremity Ankle Circles/Pumps: AROM;Both;15 reps Long Arc Quad: AAROM;Right;15 reps;Supine Heel Slides: Supine;AAROM;15 reps;Right Hip ABduction/ADduction: AAROM;Supine;Right;15 reps    General Comments        Pertinent Vitals/Pain Pain Assessment: 0-10 Pain Score: 8  Pain Location: Rt hip  Pain Descriptors / Indicators: Grimacing Pain Intervention(s): Limited activity within patient's tolerance;Monitored during session;Patient requesting  pain meds-RN notified    Home Living                      Prior Function            PT Goals (current goals can now be found in the care plan section)      Frequency           PT Plan Current plan remains appropriate    Co-evaluation              AM-PAC PT "6 Clicks" Daily Activity  Outcome Measure  Difficulty turning over in bed (including adjusting bedclothes, sheets and blankets)?: Unable Difficulty moving from lying on back to sitting on the side of the bed? : Unable Difficulty sitting down on and standing up from a chair with arms (e.g., wheelchair, bedside commode, etc,.)?:  Unable Help needed moving to and from a bed to chair (including a wheelchair)?: A Lot Help needed walking in hospital room?: Total Help needed climbing 3-5 steps with a railing? : Total 6 Click Score: 7    End of Session Equipment Utilized During Treatment: Gait belt Activity Tolerance: Patient limited by pain;Patient limited by fatigue Patient left: in chair;with family/visitor present;with call bell/phone within reach Nurse Communication: Other (comment)(pain meds, orthostatic vitals, pedRW)       Time: 1501-1540 PT Time Calculation (min) (ACUTE ONLY): 39 min  Charges:  $Therapeutic Exercise: 8-22 mins $Therapeutic Activity: 23-37 mins                    G Codes:      3:53 PM, 03/28/17 Rosamaria LintsAllan C Buccola, PT, DPT Physical Therapist -  [phone] [facility](ASCOM)  [phone] (mobile)     Buccola,Allan C 03/28/2017, 3:49 PM

## 2017-03-29 LAB — BASIC METABOLIC PANEL
Anion gap: 6 (ref 5–15)
BUN: 49 mg/dL — ABNORMAL HIGH (ref 6–20)
CHLORIDE: 112 mmol/L — AB (ref 101–111)
CO2: 19 mmol/L — ABNORMAL LOW (ref 22–32)
Calcium: 7.8 mg/dL — ABNORMAL LOW (ref 8.9–10.3)
Creatinine, Ser: 1.89 mg/dL — ABNORMAL HIGH (ref 0.61–1.24)
GFR calc non Af Amer: 27 mL/min — ABNORMAL LOW (ref >60)
GFR, EST AFRICAN AMERICAN: 32 mL/min — AB (ref >60)
Glucose, Bld: 99 mg/dL (ref 65–99)
POTASSIUM: 4 mmol/L (ref 3.5–5.1)
SODIUM: 137 mmol/L (ref 135–145)

## 2017-03-29 LAB — CBC
HEMATOCRIT: 22.3 % — AB (ref 40.0–52.0)
HEMOGLOBIN: 7.2 g/dL — AB (ref 13.0–18.0)
MCH: 27.5 pg (ref 26.0–34.0)
MCHC: 32.3 g/dL (ref 32.0–36.0)
MCV: 85 fL (ref 80.0–100.0)
Platelets: 55 10*3/uL — ABNORMAL LOW (ref 150–440)
RBC: 2.63 MIL/uL — ABNORMAL LOW (ref 4.40–5.90)
RDW: 14.6 % — ABNORMAL HIGH (ref 11.5–14.5)
WBC: 7.4 10*3/uL (ref 3.8–10.6)

## 2017-03-29 LAB — HEMOGLOBIN AND HEMATOCRIT, BLOOD
HEMATOCRIT: 26.6 % — AB (ref 40.0–52.0)
Hemoglobin: 9 g/dL — ABNORMAL LOW (ref 13.0–18.0)

## 2017-03-29 LAB — GLUCOSE, CAPILLARY: Glucose-Capillary: 105 mg/dL — ABNORMAL HIGH (ref 65–99)

## 2017-03-29 LAB — PREPARE RBC (CROSSMATCH)

## 2017-03-29 MED ORDER — DOCUSATE SODIUM 100 MG PO CAPS: 100.0000 mg | ORAL_CAPSULE | Freq: Two times a day (BID) | ORAL | 0 refills | Status: AC

## 2017-03-29 MED ORDER — FERROUS SULFATE 325 (65 FE) MG PO TABS: 325.0000 mg | ORAL_TABLET | Freq: Two times a day (BID) | ORAL | 0 refills | Status: AC

## 2017-03-29 MED ORDER — ASPIRIN EC 325 MG PO TBEC: 325.0000 mg | DELAYED_RELEASE_TABLET | Freq: Every day | ORAL | 0 refills | Status: AC

## 2017-03-29 MED ORDER — SODIUM CHLORIDE 0.9 % IV SOLN
Freq: Once | INTRAVENOUS | Status: AC
  Administered 2017-03-29: 11:00:00 via INTRAVENOUS

## 2017-03-29 NOTE — Progress Notes (Signed)
Pt discharge home via EMS.

## 2017-03-29 NOTE — Progress Notes (Signed)
Blood Administration complete.   Harvie HeckMelanie Patric Buckhalter, RN

## 2017-03-29 NOTE — Progress Notes (Signed)
Blood Transfusion started at 11:10am.   Harvie HeckMelanie Zamirah Denny, RN

## 2017-03-29 NOTE — Progress Notes (Signed)
PT Cancellation Note  Patient Details Name: Michael Shelton MRN: [id number] DOB: 06/06/1913   Cancelled Treatment:    Reason Eval/Treat Not Completed: Patient at procedure or test/unavailable; Per nursing pt in prep for transfusion and will not be available for PT services until the afternoon.  Pt's Hg 7.2 and HCT 22.3 prior to transfusion.  Will attempt to see pt at a future date/time as medically appropriate.    Ovidio Hanger. Scott Magalie Almon PT, DPT 03/29/17, 10:58 AM

## 2017-03-29 NOTE — Care Management Important Message (Signed)
Important Message  Patient Details  Name: Dewitt Hoesen Stecklein MRN: [id number] Date of Birth: 08/10/1913   Medicare Important Message Given:  Yes    Marily MemosLisa M Oris Staffieri, RN 03/29/2017, 12:47 PM

## 2017-03-29 NOTE — Discharge Summary (Signed)
[facility] - [place] at [facility]   PATIENT NAME: Michael Shelton    MR#:  [id number]  DATE OF BIRTH:  11/19/1913  DATE OF ADMISSION:  03/25/2017   ADMITTING PHYSICIAN: Katha HammingSnehalatha Konidena, MD  DATE OF DISCHARGE: 03/29/2017  PRIMARY CARE PHYSICIAN: Kandyce RudBabaoff, Marcus, MD   ADMISSION DIAGNOSIS:   Hyponatremia [E87.1] Closed fracture of right hip, initial encounter (HCC) [S72.001A]  DISCHARGE DIAGNOSIS:   Active Problems:   Closed right hip fracture, initial encounter (HCC)   SECONDARY DIAGNOSIS:   Past Medical History:  Diagnosis Date  . GERD (gastroesophageal reflux disease)   . Hypertension     HOSPITAL COURSE:   83 year old male with past medical history significant for hypertension, GERD presents to hospital after a fall and noted to have right intertrochanteric hip fracture.   #1 right hip fracture-status post intramedullary nail placement and intertrochanteric region. -Postoperative day 4 today.  -Continue physical therapy. Pain control as tolerated. - Ice pack to the surgical site for swelling and pain -urine analysis with only rare bacteria and few WBCs noted. -physical therapy recommended rehabilitation, however family is interested in taking the patient home with home health  #2 acute on chronic anemia- post op blood loss anemia - hb baseline seems to be around 12, steady drop noted after surgery. Received total of 2 units packed RBC transfusion prior to discharge. -Will be discharged on iron supplements. PCP to recheck CBC in 1 week. -Will not be on Lovenox, will be on aspirin. Also monitor his platelets  #3 acute renal failure on CK D stage III. Baseline creatinine seems to be around 2 -received gentle hydration.  - creatinine improved to baseline with fluids. Hydrochlorothiazide has been discontinued.Sodium is also improving. -Appreciate nephrology consult.  #4 anemia and thrombocytopenia.-Noted postoperatively. No active bleeding  noted. Received 2 units packed RBC transfusion prior to discharge. Platelets around 50 5K. No indication for transfusion. PCP to follow up CBC within 1 week -Since patient did not get complete hip replacement surgery, he will not need Lovenox. Checked with orthopedic surgeon. Will be discharged on aspirin 325 mg for 2 weeks.  Patient will be discharged home with home health today    DISCHARGE CONDITIONS:   Guarded  CONSULTS OBTAINED:   Treatment Team:  Christena FlakePoggi, John J, MD Lamont DowdyKolluru, Sarath, MD  DRUG ALLERGIES:   No Known Allergies DISCHARGE MEDICATIONS:   Allergies as of 03/29/2017   No Known Allergies     Medication List    STOP taking these medications   hydrochlorothiazide 12.5 MG tablet Commonly known as:  HYDRODIURIL     TAKE these medications   aspirin EC 325 MG tablet Take 1 tablet (325 mg total) by mouth daily for 14 days.   docusate sodium 100 MG capsule Commonly known as:  COLACE Take 1 capsule (100 mg total) by mouth 2 (two) times daily.   famotidine 40 MG tablet Commonly known as:  PEPCID Take 40 mg by mouth daily.   ferrous sulfate 325 (65 FE) MG tablet Take 1 tablet (325 mg total) by mouth 2 (two) times daily with a meal.   montelukast 10 MG tablet Commonly known as:  SINGULAIR Take 10 mg by mouth daily.   oxyCODONE 5 MG immediate release tablet Commonly known as:  Oxy IR/ROXICODONE Take 1 tablet (5 mg total) by mouth every 4 (four) hours as needed for moderate pain ((score 4 to 6)).   Vitamin D (Ergocalciferol) 50000 units Caps capsule Commonly known as:  DRISDOL Take  50,000 Units by mouth every 7 (seven) days.            Durable Medical Equipment  (From admission, onward)        Start     Ordered   03/29/17 1233  For home use only DME Bedside commode  Once    Question:  Patient needs a bedside commode to treat with the following condition  Answer:  Hip fracture (HCC)   03/29/17 1232   03/29/17 1232  For home use only DME Hospital  bed  Once    Question Answer Comment  Patient has (list medical condition): right hip fracture   The above medical condition requires: Patient requires the ability to reposition frequently   Bed type Semi-electric      03/29/17 1232   03/29/17 1232  For home use only DME Walker rolling  Once    Question:  Patient needs a walker to treat with the following condition  Answer:  Hip fracture (HCC)   03/29/17 1232       DISCHARGE INSTRUCTIONS:   1. PCP follow-up for CBC and BMP in 1 week 2. Orthopedic follow-up in 2 weeks  DIET:   Cardiac diet  ACTIVITY:   Activity as tolerated  OXYGEN:   Home Oxygen: No.  Oxygen Delivery: room air  DISCHARGE LOCATION:   Home with home health   If you experience worsening of your admission symptoms, develop shortness of breath, life threatening emergency, suicidal or homicidal thoughts you must seek medical attention immediately by calling 911 or calling your MD immediately  if symptoms less severe.  You Must read complete instructions/literature along with all the possible adverse reactions/side effects for all the Medicines you take and that have been prescribed to you. Take any new Medicines after you have completely understood and accpet all the possible adverse reactions/side effects.   Please note  You were cared for by a hospitalist during your hospital stay. If you have any questions about your discharge medications or the care you received while you were in the hospital after you are discharged, you can call the unit and asked to speak with the hospitalist on call if the hospitalist that took care of you is not available. Once you are discharged, your primary care physician will handle any further medical issues. Please note that NO REFILLS for any discharge medications will be authorized once you are discharged, as it is imperative that you return to your primary care physician (or establish a relationship with a primary care physician  if you do not have one) for your aftercare needs so that they can reassess your need for medications and monitor your lab values.    On the day of Discharge:  VITAL SIGNS:   Blood pressure (!) 160/69, pulse 82, temperature 98.3 F (36.8 C), temperature source Oral, resp. rate 18, height 5' (1.524 m), weight 50 kg (110 lb 5.1 oz), SpO2 97 %.  PHYSICAL EXAMINATION:    GENERAL:  83 y.o.-year-old elderly patient lying in the bed with no acute distress.  EYES: Pupils equal, round, reactive to light and accommodation. No scleral icterus. Extraocular muscles intact.  HEENT: Head atraumatic, normocephalic. Oropharynx and nasopharynx clear.  NECK:  Supple, no jugular venous distention. No thyroid enlargement, no tenderness.  LUNGS: Normal breath sounds bilaterally, no wheezing, rales,rhonchi or crepitation. No use of accessory muscles of respiration. Decreased at the bases CARDIOVASCULAR: S1, S2 normal. No murmurs, rubs, or gallops.  ABDOMEN: Soft, nontender, nondistended. Bowel sounds present.  No organomegaly or mass.  EXTREMITIES: No pedal edema, cyanosis, or clubbing. Right hip dressing in place with some swelling of the right thigh. Right foot examined, tenderness at the tip of toenail could being grown toenail. No redness or swelling of the joints noted. NEUROLOGIC: Cranial nerves II through XII are intact. Muscle strength 5/5 in all extremities except right leg due to pain limiting mobility. Sensation intact. Gait not checked. Following simple commands PSYCHIATRIC: The patient is alert and oriented.  SKIN: No obvious rash, lesion, or ulcer.    DATA REVIEW:   CBC Recent Labs  Lab 03/29/17 0339  WBC 7.4  HGB 7.2*  HCT 22.3*  PLT 55*    Chemistries  Recent Labs  Lab 03/25/17 1007  03/29/17 0339  NA 127*   < > 137  K 4.2   < > 4.0  CL 89*   < > 112*  CO2 26   < > 19*  GLUCOSE 145*   < > 99  BUN 38*   < > 49*  CREATININE 2.17*   < > 1.89*  CALCIUM 9.1   < > 7.8*  AST 38   --   --   ALT 28  --   --   ALKPHOS 62  --   --   BILITOT 1.6*  --   --    < > = values in this interval not displayed.     Microbiology Results  No results found for this or any previous visit.  RADIOLOGY:  No results found.   Management plans discussed with the patient, family and they are in agreement.  CODE STATUS:     Code Status Orders  (From admission, onward)        Start     Ordered   03/25/17 1252  Full code  Continuous     03/25/17 1253    Code Status History    Date Active Date Inactive Code Status Order ID Comments User Context   This patient has a current code status but no historical code status.      TOTAL TIME TAKING CARE OF THIS PATIENT: 38 minutes.    Enid BaasKALISETTI,Averie Meiner M.D on 03/29/2017 at 1:17 PM  Between 7am to 6pm - Pager - [phone]  After 6pm go to [email or web address] - Social research officer, governmentpassword [email or web address]  [facility] [place] [facility]  Office  [phone]  CC: Primary care physician; Kandyce RudBabaoff, Marcus, MD   Note: This dictation was prepared with Dragon dictation along with smaller phrase technology. Any transcriptional errors that result from this process are unintentional.

## 2017-03-29 NOTE — Progress Notes (Signed)
Clinical Education officer, museum (CSW) met with patient's son Sit at bedside to discuss D/C plan. Son reported that he does not want patient to go to SNF and he will take patient home. Son requested a hospital bed. RN case Freight forwarder and MD aware of above. Please reconsult if future social work needs arise. CSW signing off.   [employer], LCSW [phone]

## 2017-03-29 NOTE — Progress Notes (Signed)
Patient alert and oriented. Family at bedside. Vital signs stable. Aquacel dressing has minimal drainage, dressing intact. Patient denies pain. RN will continue to monitor.   Harvie HeckMelanie Remon Quinto, RN

## 2017-03-29 NOTE — Care Management Note (Addendum)
Case Management Note  Patient Details  Name: Michael Shelton MRN: [id number] Date of Birth: 08/23/1913  Subjective/Objective:  Patient discharging today. Requested [facility] go in and speak with son so he could put a face with the home health PT that will be coming out. Son requesting hospital bed. Ordered from [facility]. Also requested that [facility] make sure walker and bsc delivered.   Clarification requested from Dr. Wiliam KeKalesetti regarding Lovenox order. Patient will not be discharged home on Lovenox but [facility]. Called [facility] and cancelled Lovenox.                   BED NOTE:   Patient suffers from RIGHT HIP FRACTURE. Hospital bed will reduce pain by allowing patient and right hip to be positioned in ways not feasible with a normal bed. Pain episodes frequently require changes in body position which cannot be achieved with a normal bed.    Action/Plan: [facility] for home health PT. DME: hospital bed, bsc and walker.   Expected Discharge Date:                  Expected Discharge Plan:  Home w Home Health Services  In-House Referral:     Discharge planning Services  CM Consult  Post Acute Care Choice:  Durable Medical Equipment, Home Health Choice offered to:  Adult Children  DME Arranged:  Bedside commode, Walker rolling DME Agency:  [facility]  HH Arranged:  PT HH Agency:  [facility]  Status of Service:  Completed, signed off  If discussed at Long Length of Stay Meetings, dates discussed:    Additional Comments:  Michael MemosLisa M Angelisa Winthrop, RN 03/29/2017, 12:00 PM

## 2017-03-29 NOTE — Progress Notes (Signed)
PT Cancellation Note  Patient Details Name: Michael Shelton MRN: [id number] DOB: 02/06/1914   Cancelled Treatment:    Reason Eval/Treat Not Completed: Patient declined, no reason specified; Upon entering room pt found sleeping heavily.  Pt's son present and requested no PT services this afternoon secondary to pt preparing for discharge home and son preferred pt be allowed to rest prior to discharging.  Will attempt to see pt at a future date as appropriate if pt's discharge is delayed.   Ovidio Hanger. Scott Klani Caridi PT, DPT 03/29/17, 2:23 PM

## 2017-03-29 NOTE — Progress Notes (Signed)
Patient ready for discharge. Patient family says they are unable to transport patient safely. [facility] doc notified and filled out medical necessity form and patient post tranfusion hemaglodin is 9 and [facility] doc is aware.   Harvie HeckMelanie Danely Bayliss, RN

## 2017-03-30 LAB — BPAM RBC
BLOOD PRODUCT EXPIRATION DATE: 201812122359
ISSUE DATE / TIME: 201812031100
UNIT TYPE AND RH: 5100

## 2017-03-30 LAB — TYPE AND SCREEN
ABO/RH(D): O POS
ANTIBODY SCREEN: NEGATIVE
Unit division: 0

## 2017-04-08 ENCOUNTER — Inpatient Hospital Stay: Payer: Medicare Other

## 2017-04-08 ENCOUNTER — Emergency Department: Payer: Medicare Other

## 2017-04-08 ENCOUNTER — Inpatient Hospital Stay
Admission: EM | Admit: 2017-04-08 | Discharge: 2017-04-27 | DRG: 296 | Disposition: E | Payer: Medicare Other | Attending: Internal Medicine | Admitting: Internal Medicine

## 2017-04-08 ENCOUNTER — Other Ambulatory Visit: Payer: Self-pay

## 2017-04-08 DIAGNOSIS — I469 Cardiac arrest, cause unspecified: Secondary | ICD-10-CM

## 2017-04-08 DIAGNOSIS — J189 Pneumonia, unspecified organism: Secondary | ICD-10-CM | POA: Diagnosis present

## 2017-04-08 DIAGNOSIS — J9621 Acute and chronic respiratory failure with hypoxia: Secondary | ICD-10-CM | POA: Diagnosis present

## 2017-04-08 DIAGNOSIS — K922 Gastrointestinal hemorrhage, unspecified: Secondary | ICD-10-CM | POA: Diagnosis present

## 2017-04-08 DIAGNOSIS — D649 Anemia, unspecified: Secondary | ICD-10-CM

## 2017-04-08 DIAGNOSIS — N189 Chronic kidney disease, unspecified: Secondary | ICD-10-CM | POA: Diagnosis present

## 2017-04-08 DIAGNOSIS — D62 Acute posthemorrhagic anemia: Secondary | ICD-10-CM | POA: Diagnosis present

## 2017-04-08 DIAGNOSIS — Y95 Nosocomial condition: Secondary | ICD-10-CM | POA: Diagnosis present

## 2017-04-08 DIAGNOSIS — Z87891 Personal history of nicotine dependence: Secondary | ICD-10-CM

## 2017-04-08 DIAGNOSIS — N179 Acute kidney failure, unspecified: Secondary | ICD-10-CM | POA: Diagnosis present

## 2017-04-08 DIAGNOSIS — I129 Hypertensive chronic kidney disease with stage 1 through stage 4 chronic kidney disease, or unspecified chronic kidney disease: Secondary | ICD-10-CM | POA: Diagnosis present

## 2017-04-08 DIAGNOSIS — Z7982 Long term (current) use of aspirin: Secondary | ICD-10-CM | POA: Diagnosis not present

## 2017-04-08 DIAGNOSIS — K219 Gastro-esophageal reflux disease without esophagitis: Secondary | ICD-10-CM | POA: Diagnosis present

## 2017-04-08 DIAGNOSIS — A419 Sepsis, unspecified organism: Secondary | ICD-10-CM | POA: Diagnosis present

## 2017-04-08 DIAGNOSIS — D696 Thrombocytopenia, unspecified: Secondary | ICD-10-CM | POA: Diagnosis present

## 2017-04-08 DIAGNOSIS — J690 Pneumonitis due to inhalation of food and vomit: Secondary | ICD-10-CM | POA: Diagnosis present

## 2017-04-08 DIAGNOSIS — R58 Hemorrhage, not elsewhere classified: Secondary | ICD-10-CM

## 2017-04-08 LAB — CBC WITH DIFFERENTIAL/PLATELET
BAND NEUTROPHILS: 1 %
BLASTS: 0 %
Basophils Absolute: 0 10*3/uL (ref 0–0.1)
Basophils Relative: 0 %
EOS ABS: 0 10*3/uL (ref 0–0.7)
Eosinophils Relative: 0 %
HCT: 18.8 % — ABNORMAL LOW (ref 40.0–52.0)
HEMOGLOBIN: 5.3 g/dL — AB (ref 13.0–18.0)
LYMPHS PCT: 8 %
Lymphs Abs: 0.8 10*3/uL — ABNORMAL LOW (ref 1.0–3.6)
MCH: 29.2 pg (ref 26.0–34.0)
MCHC: 28.3 g/dL — ABNORMAL LOW (ref 32.0–36.0)
MCV: 103.1 fL — ABNORMAL HIGH (ref 80.0–100.0)
MYELOCYTES: 0 %
Metamyelocytes Relative: 0 %
Monocytes Absolute: 0.4 10*3/uL (ref 0.2–1.0)
Monocytes Relative: 4 %
NEUTROS PCT: 87 %
Neutro Abs: 8.8 10*3/uL — ABNORMAL HIGH (ref 1.4–6.5)
OTHER: 0 %
PROMYELOCYTES ABS: 0 %
Platelets: 101 10*3/uL — ABNORMAL LOW (ref 150–440)
RBC: 1.83 MIL/uL — ABNORMAL LOW (ref 4.40–5.90)
RDW: 18.7 % — ABNORMAL HIGH (ref 11.5–14.5)
WBC: 10 10*3/uL (ref 3.8–10.6)
nRBC: 5 /100 WBC — ABNORMAL HIGH

## 2017-04-08 LAB — BASIC METABOLIC PANEL
ANION GAP: 15 (ref 5–15)
BUN: 57 mg/dL — ABNORMAL HIGH (ref 6–20)
CHLORIDE: 123 mmol/L — AB (ref 101–111)
CO2: 7 mmol/L — ABNORMAL LOW (ref 22–32)
Calcium: 6.7 mg/dL — ABNORMAL LOW (ref 8.9–10.3)
Creatinine, Ser: 2.47 mg/dL — ABNORMAL HIGH (ref 0.61–1.24)
GFR, EST AFRICAN AMERICAN: 23 mL/min — AB (ref >60)
GFR, EST NON AFRICAN AMERICAN: 20 mL/min — AB (ref >60)
Glucose, Bld: 109 mg/dL — ABNORMAL HIGH (ref 65–99)
POTASSIUM: 6.4 mmol/L — AB (ref 3.5–5.1)
SODIUM: 145 mmol/L (ref 135–145)

## 2017-04-08 LAB — TROPONIN I: TROPONIN I: 0.08 ng/mL — AB (ref <0.03)

## 2017-04-08 LAB — LACTIC ACID, PLASMA: LACTIC ACID, VENOUS: 13.4 mmol/L — AB (ref 0.5–1.9)

## 2017-04-08 LAB — PREPARE RBC (CROSSMATCH)

## 2017-04-08 MED ORDER — SODIUM CHLORIDE 0.9 % IV SOLN
1.0000 g | Freq: Once | INTRAVENOUS | Status: AC
  Administered 2017-04-08: 1 g via INTRAVENOUS
  Filled 2017-04-08: qty 10

## 2017-04-08 MED ORDER — ATROPINE SULFATE 1 MG/10ML IJ SOSY
1.0000 mg | PREFILLED_SYRINGE | Freq: Once | INTRAMUSCULAR | Status: AC
  Administered 2017-04-08: 1 mg via INTRAVENOUS

## 2017-04-08 MED ORDER — PANTOPRAZOLE SODIUM 40 MG IV SOLR: 40.0000 mg | Freq: Two times a day (BID) | INTRAVENOUS | Status: DC

## 2017-04-08 MED ORDER — SODIUM CHLORIDE 0.9 % IV BOLUS (SEPSIS): 1000.0000 mL | Freq: Once | INTRAVENOUS | Status: AC

## 2017-04-08 MED ORDER — ONDANSETRON HCL 4 MG/2ML IJ SOLN: 4.0000 mg | Freq: Four times a day (QID) | INTRAMUSCULAR | Status: DC | PRN

## 2017-04-08 MED ORDER — ONDANSETRON HCL 4 MG PO TABS: 4.0000 mg | ORAL_TABLET | Freq: Four times a day (QID) | ORAL | Status: DC | PRN

## 2017-04-08 MED ORDER — EPINEPHRINE PF 1 MG/ML IJ SOLN
1.0000 mg | Freq: Once | INTRAMUSCULAR | Status: AC
  Administered 2017-04-08: 1 mg via INTRAVENOUS

## 2017-04-08 MED ORDER — SODIUM CHLORIDE 0.9 % IV SOLN
80.0000 mg | Freq: Once | INTRAVENOUS | Status: AC
  Administered 2017-04-08: 80 mg via INTRAVENOUS
  Filled 2017-04-08: qty 80

## 2017-04-08 MED ORDER — SODIUM CHLORIDE 0.9 % IV BOLUS (SEPSIS)
1000.0000 mL | Freq: Once | INTRAVENOUS | Status: AC
  Administered 2017-04-08: 1000 mL via INTRAVENOUS

## 2017-04-08 MED ORDER — SODIUM CHLORIDE 0.9 % IV SOLN
INTRAVENOUS | Status: DC
  Administered 2017-04-08: 22:00:00 via INTRAVENOUS

## 2017-04-08 MED ORDER — ACETAMINOPHEN 325 MG PO TABS: 650.0000 mg | ORAL_TABLET | Freq: Four times a day (QID) | ORAL | Status: DC | PRN

## 2017-04-08 MED ORDER — ACETAMINOPHEN 650 MG RE SUPP: 650.0000 mg | Freq: Four times a day (QID) | RECTAL | Status: DC | PRN

## 2017-04-08 MED ORDER — SODIUM CHLORIDE 0.9 % IV SOLN: 10.0000 mL/h | Freq: Once | INTRAVENOUS | Status: DC

## 2017-04-08 MED ORDER — VANCOMYCIN HCL IN DEXTROSE 750-5 MG/150ML-% IV SOLN
750.0000 mg | Freq: Once | INTRAVENOUS | Status: DC
  Filled 2017-04-08: qty 150

## 2017-04-08 MED ORDER — SODIUM CHLORIDE 0.9 % IV SOLN: Freq: Once | INTRAVENOUS | Status: DC

## 2017-04-08 MED ORDER — EPINEPHRINE PF 1 MG/ML IJ SOLN: 1.0000 mg | Freq: Once | INTRAMUSCULAR | Status: DC

## 2017-04-08 MED ORDER — DEXTROSE 5 % IV SOLN
0.0000 ug/min | INTRAVENOUS | Status: DC
  Administered 2017-04-08: 2.133 ug/min via INTRAVENOUS
  Filled 2017-04-08: qty 4

## 2017-04-08 MED ORDER — PIPERACILLIN-TAZOBACTAM 3.375 G IVPB: 3.3750 g | Freq: Two times a day (BID) | INTRAVENOUS | Status: DC

## 2017-04-08 MED ORDER — SODIUM CHLORIDE 0.9 % IV SOLN
8.0000 mg/h | INTRAVENOUS | Status: DC
  Filled 2017-04-08: qty 80

## 2017-04-09 LAB — TYPE AND SCREEN
ABO/RH(D): O POS
Antibody Screen: NEGATIVE
Unit division: 0
Unit division: 0

## 2017-04-09 LAB — BPAM RBC
BLOOD PRODUCT EXPIRATION DATE: 201901072359
BLOOD PRODUCT EXPIRATION DATE: 201901072359
ISSUE DATE / TIME: 201812132143
ISSUE DATE / TIME: 201812132143
UNIT TYPE AND RH: 5100
Unit Type and Rh: 5100

## 2017-04-09 LAB — BPAM PLATELET PHERESIS
BLOOD PRODUCT EXPIRATION DATE: 201812162359
UNIT TYPE AND RH: 9500

## 2017-04-09 LAB — PREPARE PLATELET PHERESIS: Unit division: 0

## 2017-04-09 MED FILL — Medication: Qty: 2 | Status: AC

## 2017-04-09 NOTE — Progress Notes (Signed)
Sheralyn Boatmanoni, RN, has spoken with [facility] ([facility]), and per [facility] pt is not a candidate for eye or organ donation, and that body can be released.  Nursing supervisor notified.

## 2017-04-12 ENCOUNTER — Telehealth: Payer: Self-pay

## 2017-04-12 NOTE — Telephone Encounter (Signed)
Michael Shelton and Michael Shelton informed death cert is ready for pick up. Nothing further needed.

## 2017-04-12 NOTE — Telephone Encounter (Signed)
Death certificate received and placed in nurse box

## 2017-04-12 NOTE — Telephone Encounter (Signed)
Death certificate has been placed in DS folder.   

## 2017-04-27 NOTE — ED Notes (Signed)
Family at bedside at this time. Charge Erie NoeVanessa informing family of pt's status.

## 2017-04-27 NOTE — ED Notes (Signed)
Date and time results received: 2017/03/18 2202 (use smartphrase ".now" to insert current time)  Test: lactic Critical Value: 13.4  Name of Provider Notified: Derrill KayGoodman  Orders Received? Or Actions Taken?: Orders Received - See Orders for details

## 2017-04-27 NOTE — H&P (Addendum)
[facility] - [place] [facility]   PATIENT NAMDewitt Hoes: Michael Shelton    MR#:  [id number]  DATE OF BIRTH:  05/22/1913  DATE OF ADMISSION:  09-08-2016  PRIMARY CARE PHYSICIAN: Kandyce RudBabaoff, Marcus, MD   REQUESTING/REFERRING PHYSICIAN: Derrill KayGoodman, MD  CHIEF COMPLAINT:   Chief Complaint  Patient presents with  . Cardiac Arrest    HISTORY OF PRESENT ILLNESS:  Michael Shelton  is a 84 y.o. male who presents status post cardiac arrest and resuscitation.  Patient was at home when his son noticed that he was "not breathing".  He called 911 and fire rescue was first on the scene.  Per report fire rescue shock the patient a couple of times after they found him pulseless, it is unclear what his initial rhythm may have been.  When EMS got there he was still pulseless and he received CPR with 3 total rounds of epinephrine and got a pulse back.  Here in the ED his heart rate is drifted down and he became pulseless again and had another round of CPR and epi with return of pulse.  He was intubated and seemed to stabilize somewhat at that point.  Patient's hemoglobin was found to be 5 and he was found on chest x-ray to have pneumonia, highly suspicious for aspiration pneumonia.  Initially was unclear where he was bleeding from, however the patient then began to have fresh blood from his oral cavity.  Hospitalist were called for admission.  He initially had heart rate and pulse with good cardiac sounds on this writer's examination, however while this writer was discussing CODE STATUS with his son the patient became bradycardic and lost his pulse again.  He underwent another round of CPR with 3 total doses of epinephrine and regained his pulse again.  This Clinical research associatewriter had a long conversation with his son about recurrent code events and very poor prognosis for this patient in consideration of CODE STATUS.  Son stated that he was in contact with his family who is on the way and they would discuss this matter  together.  PAST MEDICAL HISTORY:   Past Medical History:  Diagnosis Date  . GERD (gastroesophageal reflux disease)   . Hypertension     PAST SURGICAL HISTORY:   Past Surgical History:  Procedure Laterality Date  . INTRAMEDULLARY (IM) NAIL INTERTROCHANTERIC Right 03/25/2017   Procedure: INTRAMEDULLARY (IM) NAIL INTERTROCHANTRIC;  Surgeon: Christena FlakePoggi, John J, MD;  Location: [facility] ORS;  Service: Orthopedics;  Laterality: Right;    SOCIAL HISTORY:   Social History   Tobacco Use  . Smoking status: Former Games developermoker  . Smokeless tobacco: Never Used  Substance Use Topics  . Alcohol use: No    FAMILY HISTORY:   Family History  Family history unknown: Yes    DRUG ALLERGIES:  No Known Allergies  MEDICATIONS AT HOME:   Prior to Admission medications   Medication Sig Start Date End Date Taking? Authorizing Provider  aspirin EC 325 MG tablet Take 1 tablet (325 mg total) by mouth daily for 14 days. 03/29/17 04/12/17 Yes Enid BaasKalisetti, Radhika, MD  docusate sodium (COLACE) 100 MG capsule Take 1 capsule (100 mg total) by mouth 2 (two) times daily. 03/29/17  Yes Enid BaasKalisetti, Radhika, MD  enoxaparin (LOVENOX) 40 MG/0.4ML injection Inject 40 mg into the skin 2 (two) times daily. 03/26/17 04/05/2017 Yes [provider]  famotidine (PEPCID) 40 MG tablet Take 40 mg by mouth daily. 09/12/15  Yes [provider]  ferrous sulfate 325 (65 FE) MG  tablet Take 1 tablet (325 mg total) by mouth 2 (two) times daily with a meal. 03/29/17  Yes Enid BaasKalisetti, Radhika, MD  montelukast (SINGULAIR) 10 MG tablet Take 10 mg by mouth daily. 09/12/15  Yes [provider]  oxyCODONE (OXY IR/ROXICODONE) 5 MG immediate release tablet Take 1 tablet (5 mg total) by mouth every 4 (four) hours as needed for moderate pain ((score 4 to 6)). 03/27/17  Yes Dedra SkeensMundy, Todd, PA-C  Vitamin D, Ergocalciferol, (DRISDOL) 50000 units CAPS capsule Take 50,000 Units by mouth every 7 (seven) days.   Yes [provider]   hydrochlorothiazide (HYDRODIURIL) 12.5 MG tablet Take 1 tablet by mouth daily. 02/16/17   [provider]    REVIEW OF SYSTEMS:  Review of Systems  Unable to perform ROS: Acuity of condition     VITAL SIGNS:   Vitals:   10-24-2016 2002 10-24-2016 2015 10-24-2016 2030 10-24-2016 2045  BP:  102/71 102/71 105/73  Pulse:  71 68 65  Resp:  17 15 17   Temp: 98.8 F (37.1 C)   (!) 94.1 F (34.5 C)  TempSrc: Rectal     SpO2:  100% 100% 100%  Weight:      Height:       Wt Readings from Last 3 Encounters:  10-24-2016 49.9 kg (110 lb)  03/29/17 50 kg (110 lb 5.1 oz)  10/05/15 48.5 kg (107 lb)    PHYSICAL EXAMINATION:  Physical Exam  Vitals reviewed. Constitutional: He appears well-developed and well-nourished. No distress.  HENT:  Head: Normocephalic and atraumatic.  Fresh blood in his oral cavity  Eyes: Conjunctivae and EOM are normal. Pupils are equal, round, and reactive to light. No scleral icterus.  Neck: Normal range of motion. Neck supple. No JVD present. No thyromegaly present.  Cardiovascular: Regular rhythm and intact distal pulses. Exam reveals no gallop and no friction rub.  No murmur heard. Bradycardic  Respiratory: He is in respiratory distress (Patient is intubated). He has no wheezes. He has no rales.  Rhonchi  GI: Soft. Bowel sounds are normal. He exhibits no distension. There is no tenderness.  Musculoskeletal: He exhibits no edema.  Lymphadenopathy:    He has no cervical adenopathy.  Neurological:  Unable to assess due to patient's critical condition  Skin: Skin is dry. No rash noted. No erythema.  Patient's skin is cold and mottled especially in the extremities  Psychiatric:  Unable to assess due to patient condition    LABORATORY PANEL:   CBC Recent Labs  Lab 10-24-2016 1941  WBC 10.0  HGB 5.3*  HCT 18.8*  PLT 101*    ------------------------------------------------------------------------------------------------------------------  Chemistries  Recent Labs  Lab 10-24-2016 1941  NA 145  K 6.4*  CL 123*  CO2 7*  GLUCOSE 109*  BUN 57*  CREATININE 2.47*  CALCIUM 6.7*   ------------------------------------------------------------------------------------------------------------------  Cardiac Enzymes Recent Labs  Lab 10-24-2016 1941  TROPONINI 0.08*   ------------------------------------------------------------------------------------------------------------------  RADIOLOGY:  Dg Chest Portable 1 View  Result Date: 2016-09-28 CLINICAL DATA:  84 year old with cardiorespiratory arrest earlier this evening. Intubation. EXAM: PORTABLE CHEST 1 VIEW COMPARISON:  03/25/2017, 05/24/2005. FINDINGS: Endotracheal tube tip at the carina. External pacing pads. Cardiac silhouette moderately enlarged, unchanged. Thoracic aorta tortuous atherosclerotic, unchanged. Hilar and mediastinal contours otherwise unremarkable. Perihilar airspace opacities in the right lung, small bilateral pleural effusions, and mild pulmonary venous hypertension without overt pulmonary edema. IMPRESSION: 1. Endotracheal tube tip at the carina. This could be withdrawn 3-4 cm for better positioning. 2. Perihilar airspace opacities involving the  right lung, likely representing pneumonia (possibly aspiration). 3. Small bilateral pleural effusions. 4. Pulmonary venous hypertension without overt interstitial edema. Electronically Signed   By: Hulan Saas M.D.   On: 04-26-17 20:08    EKG:   Orders placed or performed during the hospital encounter of 04/26/17  . EKG 12-Lead  . EKG 12-Lead    IMPRESSION AND PLAN:  Principal Problem:   Cardiac arrest (HCC) -highly suspect GI bleeding event dropping his hemoglobin followed by aspiration pneumonia causing respiratory and then subsequent cardiac arrest.  Suspect patient is still actively  bleeding given the blood being suctioned from his oral cavity.  See below for treatment of his GI bleed.  We have contacted the ICU for his post arrest protocol Active Problems:   Acute respiratory failure with hypoxia (HCC) -intubated and mechanically ventilated   HCAP (healthcare-associated pneumonia) -IV antibiotics ordered   Acute blood loss anemia -likely due to his GI bleed, we are giving him 2 units of blood pressure bag and a unit of platelets given his thrombocytopenia   GI bleed -seems to have an active upper GI bleed, we have IV Protonix started and are giving him blood, however patient's prognosis is very poor   Acute on chronic kidney failure (HCC) -due to all the above, supportive treatment as above for any potential recovery of kidney function if the patient is able to recover  All the records are reviewed and case discussed with ED provider. Management plans discussed with the patient and/or family.  DVT PROPHYLAXIS: Mechanical only  GI PROPHYLAXIS: PPI  ADMISSION STATUS: Inpatient  CODE STATUS: Full -see above HPI for conversations held with family members Code Status History    Date Active Date Inactive Code Status Order ID Comments User Context   03/25/2017 12:53 03/29/2017 23:27 Full Code [id number]  Katha Hamming, MD ED      TOTAL CRITICAL CARE TIME TAKING CARE OF THIS PATIENT: 60 minutes.   Michael Shelton 04-26-17, 9:01 PM  [facility]  Office  [phone]  CC: Primary care physician; Kandyce Rud, MD  Note:  This document was prepared using Dragon voice recognition software and may include unintentional dictation errors.

## 2017-04-27 NOTE — Progress Notes (Signed)
At 2318, Pt HR dropped again into 30's and pt lost pulse again, asystole.  CPR initiated, CODE BLUE called, and ACLS protocol in progress.  Pt received multiple doses of epinephrine and bicarb.  ROSC regained at 2329.  See CODE BLUE sheet for full details. Levophed gtt ordered to maintain MAP >65.  Bincy Varughese at bedside, has spoken with pt sons again, and pt sons have decided to make pt DNR, and to let pt pass if he cardiac arrests again.

## 2017-04-27 NOTE — ED Notes (Signed)
Report called and [facility]

## 2017-04-27 NOTE — ED Notes (Signed)
Medications not available at this time. Pharmacy called and informed of needing them sent to ED.

## 2017-04-27 NOTE — ED Notes (Addendum)
Respiratory at bedside, attempted to suction pt. Tube now positioned at 22. Respiratory to attempt suctioning pt at this time

## 2017-04-27 NOTE — ED Notes (Signed)
Date and time results received: Oct 22, 20182020 (use smartphrase ".now" to insert current time)  Test: potassium Critical Value: 6.4  Name of Provider Notified: Derrill KayGoodman  Orders Received? Or Actions Taken?: Orders Received - See Orders for details

## 2017-04-27 NOTE — ED Notes (Signed)
Date and time results received: 04/07/2017 2020 (use smartphrase ".now" to insert current time)  Test: troponin Critical Value: 0.08  Name of Provider Notified: Derrill KayGoodman  Orders Received? Or Actions Taken?: Orders Received - See Orders for details

## 2017-04-27 NOTE — ED Provider Notes (Signed)
[facility] Emergency Department Provider Note   ____________________________________________   I have reviewed the triage vital signs and the nursing notes.   HISTORY  Chief Complaint Cardiac Arrest   History limited by and level 5 caveat due to medical condition   HPI Michael Shelton is a 84 y.o. male who presents to the emergency department today via EMS as emergency traffic after cardiopulmonary arrest. Per EMS report the patient is coming from home. Family noticed the patient having difficulty with breathing. Was than unresponsive on the floor. Apparently when fire arrived on scene he was having agonal breathing. Unknown initial rhythm however fire department did administer 2 shocks. EMS stated that their first rhythm was asystole. However they were able to regain pulses after 3 rounds of epinephrine. They did intubate with an ET tube.   Per medical record review patient has a history of recent hip surgery secondary to fracture.   Past Medical History:  Diagnosis Date  . GERD (gastroesophageal reflux disease)   . Hypertension     Patient Active Problem List   Diagnosis Date Noted  . Closed right hip fracture, initial encounter (HCC) 03/25/2017    Past Surgical History:  Procedure Laterality Date  . INTRAMEDULLARY (IM) NAIL INTERTROCHANTERIC Right 03/25/2017   Procedure: INTRAMEDULLARY (IM) NAIL INTERTROCHANTRIC;  Surgeon: Christena FlakePoggi, John J, MD;  Location: [facility] ORS;  Service: Orthopedics;  Laterality: Right;    Prior to Admission medications   Medication Sig Start Date End Date Taking? Authorizing Provider  aspirin EC 325 MG tablet Take 1 tablet (325 mg total) by mouth daily for 14 days. 03/29/17 04/12/17  Enid BaasKalisetti, Radhika, MD  docusate sodium (COLACE) 100 MG capsule Take 1 capsule (100 mg total) by mouth 2 (two) times daily. 03/29/17   Enid BaasKalisetti, Radhika, MD  famotidine (PEPCID) 40 MG tablet Take 40 mg by mouth daily. 09/12/15   [provider]   ferrous sulfate 325 (65 FE) MG tablet Take 1 tablet (325 mg total) by mouth 2 (two) times daily with a meal. 03/29/17   Enid BaasKalisetti, Radhika, MD  montelukast (SINGULAIR) 10 MG tablet Take 10 mg by mouth daily. 09/12/15   [provider]  oxyCODONE (OXY IR/ROXICODONE) 5 MG immediate release tablet Take 1 tablet (5 mg total) by mouth every 4 (four) hours as needed for moderate pain ((score 4 to 6)). 03/27/17   Dedra SkeensMundy, Todd, PA-C  Vitamin D, Ergocalciferol, (DRISDOL) 50000 units CAPS capsule Take 50,000 Units by mouth every 7 (seven) days.    [provider]    Allergies Patient has no known allergies.  No family history on file.  Social History Social History   Tobacco Use  . Smoking status: Former Games developermoker  . Smokeless tobacco: Never Used  Substance Use Topics  . Alcohol use: No  . Drug use: No    Review of Systems Unable to obtain given medical condition  ____________________________________________   PHYSICAL EXAM:  VITAL SIGNS: ED Triage Vitals [04/01/2017 1934]  Enc Vitals Group     BP (!) 162/75     Pulse Rate 96     Resp 20     Temp      Temp src      SpO2 93 %   Constitutional: Unresponsive.  Eyes: Conjunctivae are normal. Pupils fixed.  ENT   Head: Normocephalic and atraumatic.   Nose: No congestion/rhinnorhea.   Mouth/Throat: Endotracheal tube in place.    Neck: No stridor. Hematological/Lymphatic/Immunilogical: No cervical lymphadenopathy. Cardiovascular: Pulseless Respiratory: Endotracheal tube in  place. Bilateral lung sounds. Gastrointestinal: Soft and non tender. No rebound. No guarding.  Genitourinary: Deferred Musculoskeletal: Normal range of motion in all extremities. No lower extremity edema. Neurologic:  Unresponsive Skin:  Skin is warm, dry and intact. No rash noted. ____________________________________________    LABS (pertinent positives/negatives)  CBC wbc 10.0, hgb 5.3, plt 101 Trop 0.08 BMP k 6.4, cr  2.47 ____________________________________________   EKG  I, Phineas SemenGraydon Sarah Baez, attending physician, personally viewed and interpreted this EKG  EKG Time: 1933 Rate: 93 Rhythm: sinus rhythm Axis: normal Intervals: qtc 370 QRS: narrow ST changes: no st elevation, t wave inversion v4, v5 Impression: abnormal ekg   ____________________________________________    RADIOLOGY  CXR ET tube at the carina  I, Sarahgrace Broman, personally viewed and evaluated these images (plain radiographs) as part of my medical decision making. ____________________________________________   PROCEDURES  Procedures  CRITICAL CARE Performed by: Phineas SemenGOODMAN, Jerusha Reising   Total critical care time: 45 minutes  Critical care time was exclusive of separately billable procedures and treating other patients.  Critical care was necessary to treat or prevent imminent or life-threatening deterioration.  Critical care was time spent personally by me on the following activities: development of treatment plan with patient and/or surrogate as well as nursing, discussions with consultants, evaluation of patient's response to treatment, examination of patient, obtaining history from patient or surrogate, ordering and performing treatments and interventions, ordering and review of laboratory studies, ordering and review of radiographic studies, pulse oximetry and re-evaluation of patient's condition.  ____________________________________________   INITIAL IMPRESSION / ASSESSMENT AND PLAN / ED COURSE  Pertinent labs & imaging results that were available during my care of the patient were reviewed by me and considered in my medical decision making (see chart for details).  Patient presented to the emergency department today via EMS undergoing CPR.  Unclear initial rhythm however he did receive multiple shocks by fire department and then multiple rounds of epinephrine by EMS.  Their initial rhythm was asystole.  Upon  arrival to the emergency department the patient's pupils were fixed.  He did not have a pulse and CPR was initiated.  We were able to regain pulse fairly quickly.  I discussed with family who at this time continued to want patient to be full code.  His blood work did show significant anemia.  Suctioning of ET tube did show coffee-ground aspiration.  Patient was admitted to the hospitalist.  While the hospitalist was in the room the patient became bradycardic and lost pulses. CPR was again started and pulses were regained.  ____________________________________________   FINAL CLINICAL IMPRESSION(S) / ED DIAGNOSES  Final diagnoses:  Cardiopulmonary arrest (HCC)  Anemia, unspecified type  Gastrointestinal hemorrhage, unspecified gastrointestinal hemorrhage type     Note: This dictation was prepared with Dragon dictation. Any transcriptional errors that result from this process are unintentional     Phineas SemenGoodman, Kadeshia Kasparian, MD 04/10/2017 1540

## 2017-04-27 NOTE — ED Notes (Signed)
Recollect type and screen and sent to lab.

## 2017-04-27 NOTE — ED Notes (Signed)
Pt arrived weak pulse.

## 2017-04-27 NOTE — ED Notes (Signed)
RN Amy, EDT Narda RutherfordLashea at bedside while this RN calls report to ICU

## 2017-04-27 NOTE — ED Notes (Addendum)
Rhythm check- no pulse, CPR resumed 

## 2017-04-27 NOTE — ED Notes (Signed)
Family informed this RN that pt has been fine all day today and has PT earlier with no complaints. Family also states no signs of blood in stool and no trouble urinating. Family states son witnessed pt and not responding so they called 911 at that time. Family reports no emesis of coffee grounds or fever.

## 2017-04-27 NOTE — ED Triage Notes (Addendum)
Pt comes from home via [facility] with weak femoral pulse. Per EMS pt was found in bed stating he cant breath. Family initiated CPR, fire arrived and took over CPR. Pt was shocked twice.  EMS states pt was asystole upon arrival. EMS gave pt 3 epi and got pulse back. Pt arrived with weak pulse. VS- BP 128/60, BS-207,EMS also stated they had 3 attempts at placing RomeoKing because of coffee ground return. Pt did have recent hip surgery. Lungs clear on right, but diminished on left per EMS. Pt placed on monitor. MD Derrill KayGoodman at bedside

## 2017-04-27 NOTE — ED Notes (Signed)
Pulse check no pulse,1 EPI given through IO

## 2017-04-27 NOTE — ED Notes (Addendum)
Pt bleeding from mouth. Lab called and informed of emergency release blood needed. MD Augusto GarbeGoodman and Willis at bedside

## 2017-04-27 NOTE — ED Notes (Signed)
Pt suctioned at this time. Bright red blood returned

## 2017-04-27 NOTE — ED Notes (Signed)
Foley inserted. Pt has bright red blood coming from penis

## 2017-04-27 NOTE — ED Notes (Signed)
MD admitting Michael Shelton at bedside. Updating family on pt status and plan of care

## 2017-04-27 NOTE — ED Notes (Signed)
Pulse check and rhythm. Pt has good femoral pulse

## 2017-04-27 NOTE — Progress Notes (Signed)
Pt again has HR has dropped to 30's and lost pulse.  Pt is now DNR.  Per Bennett ScrapeBincy Varughese NP, no interventions are to be performed,  Pt expired.  No pulse, respirations, or BP.  Pt son's at bedside.  Bincy at bedside to pronounce.  Pt's sons very appreciative for care of pt.

## 2017-04-27 NOTE — Consult Note (Signed)
PULMONARY / CRITICAL CARE MEDICINE   Name: Michael Shelton MRN: [id number] DOB: 06/17/1913    ADMISSION DATE:  04/22/2017  CONSULTATION DATE:  04/07/2017  REFERRING MD:  Dr. Anne HahnWillis  CHIEF COMPLAINT:  Cardiac Arrest  HISTORY OF PRESENT ILLNESS:   Michael Shelton is an 84 year old male who apparently had hip surgery 2 weeks ago and was living with his son at home. On 12/13 his son noticed that he was not breathing and called EMS.  It is unclear what his initial rhythm was but when EMS arrived on the scene patient was pulseless .  Patient received ACLS drugs and chest compression and was revived and was brought to the ED.  Patient was intubated .  In the ED patient lost his pulse again and was revived again .  Patient was pouring out blood from his oral cavity.  His Hgb was down to 5.3 mg/dl.  Patient was transfused 2 units of blood emergently and was transferred to the ICU.  Patient coded twice with ROSC.  Explained to the family that there is nil chances of him surviving overnight and need to call rest of  family members .  Also discussed the case with E-Link physician who agrees that we are not obligated to run long codes .  Explained to the family members again and I have clearly mentioned that if the patient codes again , no chest compressions will be done on him .  Family agrees and understands that the patient is critically ill and will not survive.  PAST MEDICAL HISTORY :  He  has a past medical history of GERD (gastroesophageal reflux disease) and Hypertension.  PAST SURGICAL HISTORY: He  has a past surgical history that includes Intramedullary (im) nail intertrochanteric (Right, 03/25/2017).  No Known Allergies  No current facility-administered medications on file prior to encounter.    Current Outpatient Medications on File Prior to Encounter  Medication Sig  . aspirin EC 325 MG tablet Take 1 tablet (325 mg total) by mouth daily for 14 days.  Marland Kitchen. docusate sodium (COLACE) 100 MG capsule Take 1  capsule (100 mg total) by mouth 2 (two) times daily.  Marland Kitchen. enoxaparin (LOVENOX) 40 MG/0.4ML injection Inject 40 mg into the skin 2 (two) times daily.  . famotidine (PEPCID) 40 MG tablet Take 40 mg by mouth daily.  . ferrous sulfate 325 (65 FE) MG tablet Take 1 tablet (325 mg total) by mouth 2 (two) times daily with a meal.  . montelukast (SINGULAIR) 10 MG tablet Take 10 mg by mouth daily.  Marland Kitchen. oxyCODONE (OXY IR/ROXICODONE) 5 MG immediate release tablet Take 1 tablet (5 mg total) by mouth every 4 (four) hours as needed for moderate pain ((score 4 to 6)).  . Vitamin D, Ergocalciferol, (DRISDOL) 50000 units CAPS capsule Take 50,000 Units by mouth every 7 (seven) days.  . hydrochlorothiazide (HYDRODIURIL) 12.5 MG tablet Take 1 tablet by mouth daily.    FAMILY HISTORY:  His has no family status information on file.    SOCIAL HISTORY: He  reports that he has quit smoking. he has never used smokeless tobacco. He reports that he does not drink alcohol or use drugs.  REVIEW OF SYSTEMS:   Unable to obtain  SUBJECTIVE:  Unable to obtain  VITAL SIGNS: BP (!) 66/54   Pulse 74   Temp (!) 92.7 F (33.7 C)   Resp 19   Ht 5' (1.524 m)   Wt 110 lb (49.9 kg)   SpO2 100%  BMI 21.48 kg/m   HEMODYNAMICS:    VENTILATOR SETTINGS: Vent Mode: PRVC FiO2 (%):  [100 %] 100 % Set Rate:  [15 bmp-16 bmp] 15 bmp Vt Set:  [380 mL] 380 mL PEEP:  [5 cmH20] 5 cmH20 Plateau Pressure:  [20 cmH20] 20 cmH20  INTAKE / OUTPUT: No intake/output data recorded. LABS:  BMET Recent Labs  Lab 30-Nov-2016 1941  NA 145  K 6.4*  CL 123*  CO2 7*  BUN 57*  CREATININE 2.47*  GLUCOSE 109*    Electrolytes Recent Labs  Lab 30-Nov-2016 1941  CALCIUM 6.7*    CBC Recent Labs  Lab 30-Nov-2016 1941  WBC 10.0  HGB 5.3*  HCT 18.8*  PLT 101*    Coag's No results for input(s): APTT, INR in the last 168 hours.  Sepsis Markers Recent Labs  Lab 30-Nov-2016 1941  LATICACIDVEN 13.4*    ABG No results for  input(s): PHART, PCO2ART, PO2ART in the last 168 hours.  Liver Enzymes No results for input(s): AST, ALT, ALKPHOS, BILITOT, ALBUMIN in the last 168 hours.  Cardiac Enzymes Recent Labs  Lab 30-Nov-2016 1941  TROPONINI 0.08*    Glucose No results for input(s): GLUCAP in the last 168 hours.  Imaging Dg Chest Portable 1 View  Result Date: Sep 30, 2016 CLINICAL DATA:  84 year old with cardiorespiratory arrest earlier this evening. Intubation. EXAM: PORTABLE CHEST 1 VIEW COMPARISON:  03/25/2017, 05/24/2005. FINDINGS: Endotracheal tube tip at the carina. External pacing pads. Cardiac silhouette moderately enlarged, unchanged. Thoracic aorta tortuous atherosclerotic, unchanged. Hilar and mediastinal contours otherwise unremarkable. Perihilar airspace opacities in the right lung, small bilateral pleural effusions, and mild pulmonary venous hypertension without overt pulmonary edema. IMPRESSION: 1. Endotracheal tube tip at the carina. This could be withdrawn 3-4 cm for better positioning. 2. Perihilar airspace opacities involving the right lung, likely representing pneumonia (possibly aspiration). 3. Small bilateral pleural effusions. 4. Pulmonary venous hypertension without overt interstitial edema. Electronically Signed   By: Hulan Saashomas  Lawrence M.D.   On: Sep 30, 2016 20:08    Note:Patient lost his pulse again and went into asystole.  Patient was pronounced dead at 11:58 on 30-Nov-2016.  Four sons were at the bedside.  Pulmonary and Critical Care Medicine [facility] Pager: [phone]  Sep 30, 2016, 11:38 PM

## 2017-04-27 NOTE — ED Notes (Signed)
Pharmacy called for medications. 

## 2017-04-27 NOTE — ED Notes (Signed)
Pharmacy called and notified of medications that are needed. Per Barbara CowerJason they will send Protonix bolus and calcium gluconate

## 2017-04-27 NOTE — ED Notes (Signed)
Lab called to add on lactic acid 

## 2017-04-27 NOTE — Progress Notes (Signed)
Pt arrived from ED, upon placing pt on monitors pt HR dropped to 30's and lost pulse, asystole.  CPR initiated and CODE BLUE called, and ACLS protocol in progress.  Pt received multiple doses of epinephrine and bicarb, was receiving dose of calcium gluconate upon arrival from ED, as well as 1st unit of blood infusing.  ROSC achieved at 2248.  See CODE BLUE sheet for full details.  Bincy Varughese NP at bedside running code has spoken with pt's son who wish for pt to remain Full Code at this time.

## 2017-04-27 NOTE — ED Notes (Addendum)
Pt has no pulse at this time. Atropine given, CPR started by this RN, MD Anne HahnWillis and Derrill KayGoodman at bedside.

## 2017-04-27 NOTE — Death Summary Note (Signed)
DEATH SUMMARY   Patient Details  Name: Michael Shelton MRN: [id number] DOB: 05/01/1913  Admission/Discharge Information   Admit Date:  04/11/2017  Date of Death:  04/10/2017  Time of Death:  11:58  Length of Stay: 1  Referring Physician: Kandyce RudBabaoff, Marcus, MD   Reason(s) for Hospitalization  Cardiac arrest  Diagnoses  Preliminary cause of death:  Secondary Diagnoses (including complications and co-morbidities):  Principal Problem:   Cardiac arrest [facility](HCC) Active Problems:   Acute on chronic respiratory failure with hypoxia (HCC)   HCAP (healthcare-associated pneumonia)   Sepsis (HCC)   Acute blood loss anemia   Acute on chronic kidney failure [facility](HCC)   Brief Hospital Course (including significant findings, care, treatment, and services provided and events leading to death)  Michael Shelton is a [age] y.o. year old male who had recent hip surgery and was living with his son at home.  Son noticed that the patient was not breathing.  EMS was called .  EMS noted that the patient was in asystole .  Patient received ACLS drugs and chest compression.  Patient was brought to the hospital where he lost his pulse and was revived several times.  Family was made aware of the situation and explained clearly that the patient would not survive this night(12/13).  Patient went into asystole again.  He was pronounced dead at 11:58 pm on 12/13.  Four sons were present at the bedside.    Pertinent Labs and Studies  Significant Diagnostic Studies Dg Chest 1 View  Result Date: 03/25/2017 CLINICAL DATA:  Right hip fracture due to a fall today. Preoperative exam. EXAM: CHEST 1 VIEW COMPARISON:  PA and lateral chest 05/24/2005. FINDINGS: There is cardiomegaly without edema. Lungs are clear. No pneumothorax or pleural fluid. Atherosclerosis noted. IMPRESSION: Cardiomegaly without acute disease. Atherosclerosis. Electronically Signed   By: Drusilla Kannerhomas  Dalessio M.D.   On: 03/25/2017 11:31   [place] Renal  Result Date:  03/27/2017 CLINICAL DATA:  Acute renal failure EXAM: RENAL / URINARY TRACT ULTRASOUND COMPLETE COMPARISON:  CT scan October 05, 2015 FINDINGS: Right Kidney: Length: 8.6 cm.  Multiple cysts with the largest measuring 6.2 cm. Left Kidney: Length: 9.5 cm.  Multiple cysts.  The largest measures 2.8 cm. Bladder: Decompressed with a Foley catheter. IMPRESSION: Renal cysts.  No cause for acute renal failure. Electronically Signed   By: Gerome Samavid  Williams III M.D   On: 03/27/2017 14:54   Dg Chest Portable 1 View  Result Date: 04/12/2017 CLINICAL DATA:  84 year old with cardiorespiratory arrest earlier this evening. Intubation. EXAM: PORTABLE CHEST 1 VIEW COMPARISON:  03/25/2017, 05/24/2005. FINDINGS: Endotracheal tube tip at the carina. External pacing pads. Cardiac silhouette moderately enlarged, unchanged. Thoracic aorta tortuous atherosclerotic, unchanged. Hilar and mediastinal contours otherwise unremarkable. Perihilar airspace opacities in the right lung, small bilateral pleural effusions, and mild pulmonary venous hypertension without overt pulmonary edema. IMPRESSION: 1. Endotracheal tube tip at the carina. This could be withdrawn 3-4 cm for better positioning. 2. Perihilar airspace opacities involving the right lung, likely representing pneumonia (possibly aspiration). 3. Small bilateral pleural effusions. 4. Pulmonary venous hypertension without overt interstitial edema. Electronically Signed   By: Hulan Saashomas  Lawrence M.D.   On: 04/22/2017 20:08   Dg Hip Operative Unilat W Or W/o Pelvis Right  Result Date: 03/25/2017 CLINICAL DATA:  Right hip fracture. EXAM: OPERATIVE RIGHT HIP (WITH PELVIS IF PERFORMED) 7 VIEWS TECHNIQUE: Fluoroscopic spot image(s) were submitted for interpretation post-operatively. COMPARISON:  Radiographs of earlier today. FINDINGS: Intraoperative imaging demonstrates placement of intramedullary screw  and nail fixation device. No acute hardware complication or interval fracture. IMPRESSION:  Intraoperative imaging of proximal right femoral fixation. Electronically Signed   By: Jeronimo GreavesKyle  Talbot M.D.   On: 03/25/2017 19:58   Dg Hip Unilat With Pelvis 2-3 Views Right  Result Date: 03/25/2017 CLINICAL DATA:  Status post fall today with onset of right hip pain. Initial encounter. EXAM: DG HIP (WITH OR WITHOUT PELVIS) 2-3V RIGHT COMPARISON:  None. FINDINGS: The patient has an acute right intertrochanteric fracture. No other acute bony or joint abnormality is seen. Bones appear mildly osteopenic. Atherosclerosis noted. IMPRESSION: Acute right intertrochanteric fracture. Electronically Signed   By: Drusilla Kannerhomas  Dalessio M.D.   On: 03/25/2017 11:31    Microbiology No results found for this or any previous visit (from the past 240 hour(s)).  Lab Basic Metabolic Panel: Recent Labs  Lab 04/24/2017 1941  NA 145  K 6.4*  CL 123*  CO2 7*  GLUCOSE 109*  BUN 57*  CREATININE 2.47*  CALCIUM 6.7*   Liver Function Tests: No results for input(s): AST, ALT, ALKPHOS, BILITOT, PROT, ALBUMIN in the last 168 hours. No results for input(s): LIPASE, AMYLASE in the last 168 hours. No results for input(s): AMMONIA in the last 168 hours. CBC: Recent Labs  Lab 04/02/2017 1941  WBC 10.0  NEUTROABS 8.8*  HGB 5.3*  HCT 18.8*  MCV 103.1*  PLT 101*   Cardiac Enzymes: Recent Labs  Lab 04/12/2017 1941  TROPONINI 0.08*   Sepsis Labs: Recent Labs  Lab 04/03/2017 1941  WBC 10.0  LATICACIDVEN 13.4*    Procedures/Operations  12/13 ET tube>>  [name] 2017/02/05, 12:06 AM

## 2017-04-27 NOTE — ED Notes (Addendum)
Respiratory attempted to suction pt and some brown coffee ground return. Rectal temp checked and pt has visible blood on thermometer and in depends. MD Derrill KayGoodman notified. Pt banded for type and screen.

## 2017-04-27 NOTE — ED Notes (Addendum)
Rhythm check- no pulse, CPR resumed

## 2017-04-27 NOTE — ED Notes (Signed)
Respiratory called and coming to check pt's tube. Pt was sat at 85%

## 2017-04-27 NOTE — ED Notes (Signed)
Rhythm check, pt has pulse

## 2017-04-27 DEATH — deceased

## 2018-08-17 IMAGING — DX DG ABDOMEN 1V
1 series · 1 of 1 positions shown · non-contrast
Comparison: CT of the abdomen and pelvis from 10/05/2015

CLINICAL DATA: Assess retroperitoneal bleed.

EXAM:
ABDOMEN - 1 VIEW

[abdomen kub]
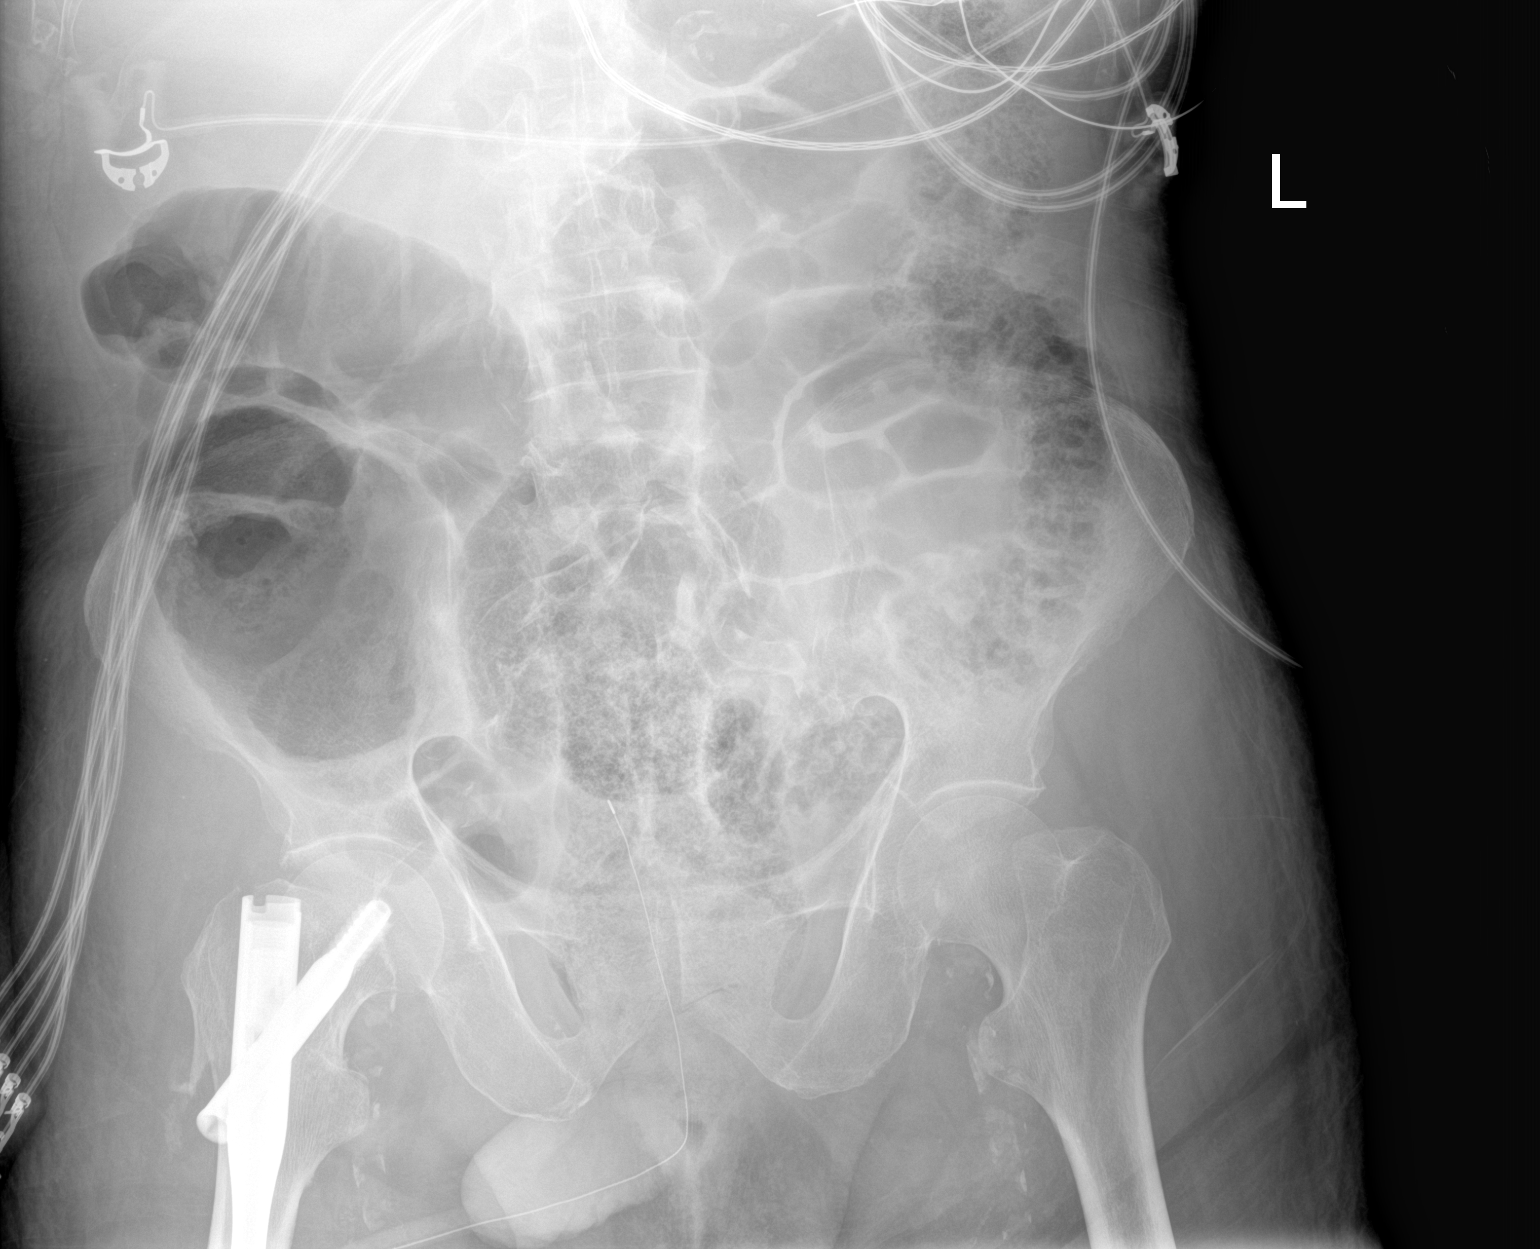

[1 of 1 positions shown; findings below may reference images not displayed]

FINDINGS: Evaluation for retroperitoneal bleed is limited on radiograph.

The visualized bowel gas pattern is unremarkable. Scattered air and
stool filled loops of colon are seen; no abnormal dilatation of
small bowel loops is seen to suggest small bowel obstruction. No
free intra-abdominal air is identified, though evaluation for free
air is limited on a single supine view.

The visualized osseous structures are within normal limits; the
sacroiliac joints are unremarkable in appearance. The visualized
lung bases are essentially clear. External pacing pads are noted.
The patient's right femoral hardware is partially characterized but
appears grossly unremarkable.
IMPRESSION: Unremarkable bowel gas pattern; no free intra-abdominal air seen.
Small to moderate amount of stool noted in the colon.

## 2019-03-10 IMAGING — US US RENAL
1 series · 14 of 25 positions shown · non-contrast
Comparison: CT scan October 05, 2015

CLINICAL DATA: Acute renal failure

EXAM:
RENAL / URINARY TRACT ULTRASOUND COMPLETE

[Series 1: us renal · 0.22mm/px · 14 of 47 slices shown]
[im 1/47]
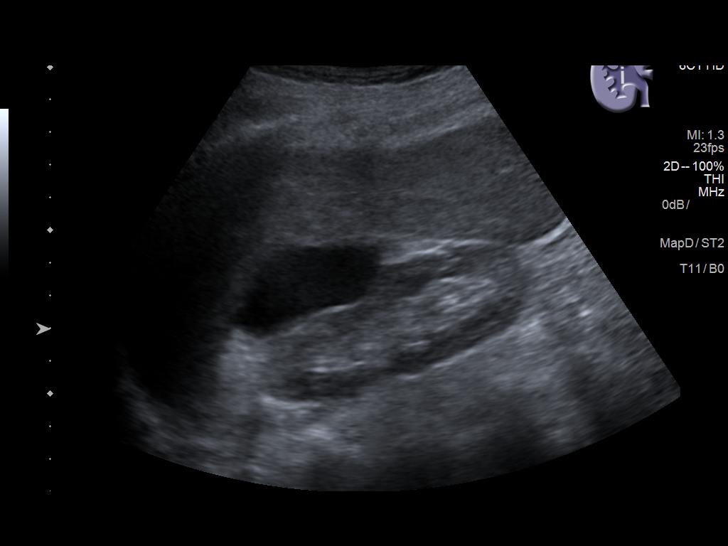
[im 4/47]
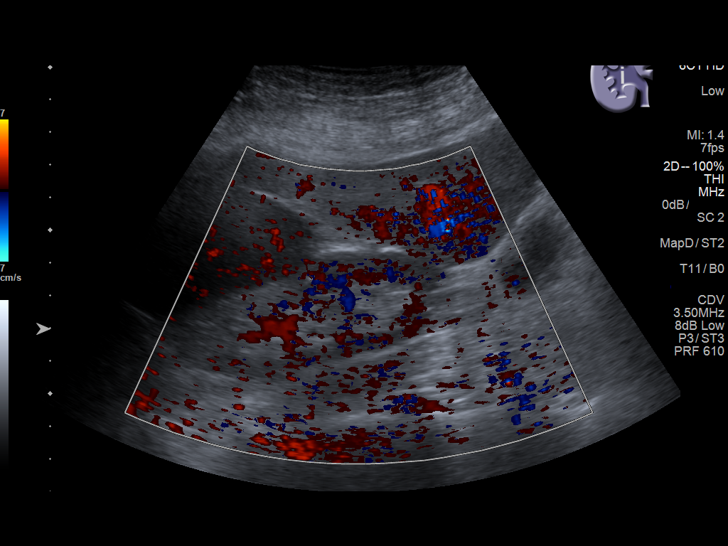
[im 8/47]
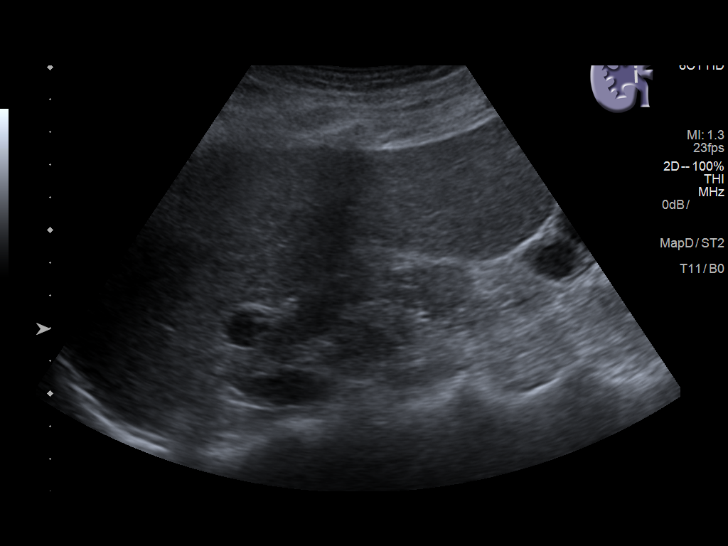
[im 12/47]
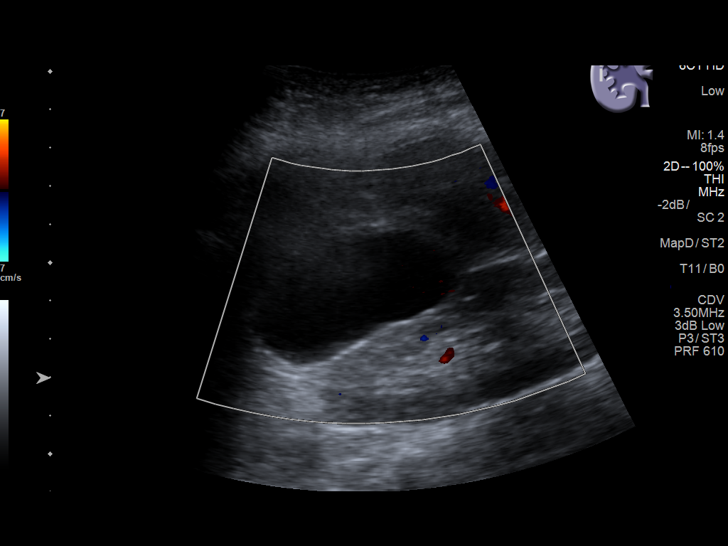
[im 16/47]
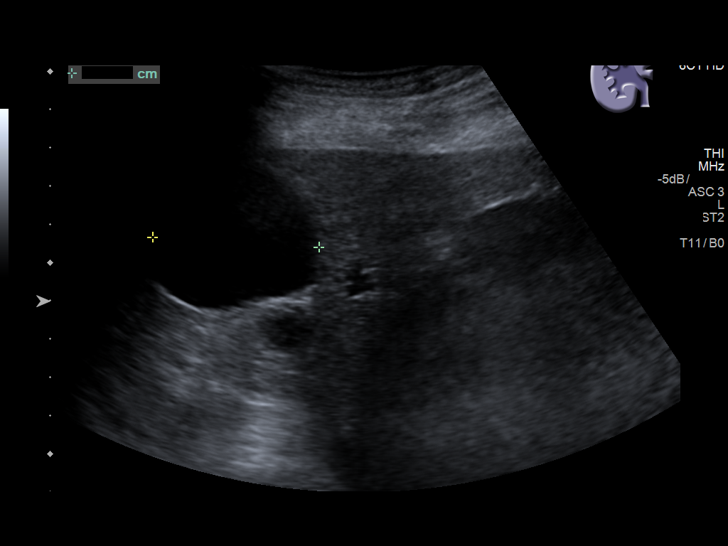
[im 18/47]
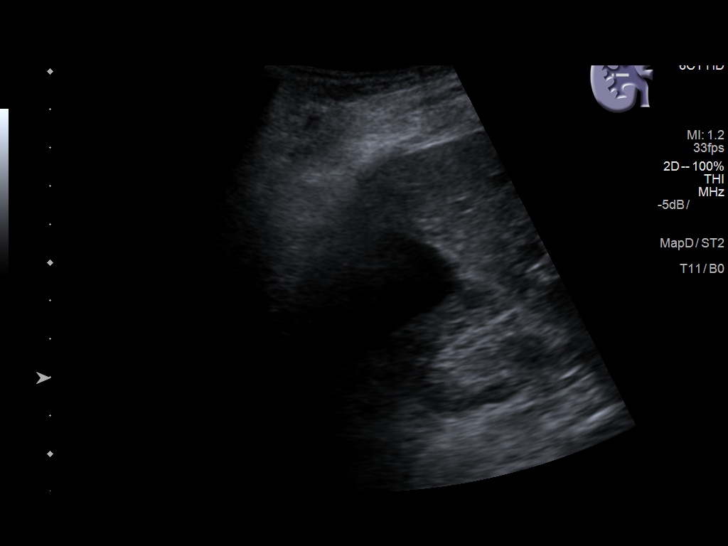
[im 22/47]
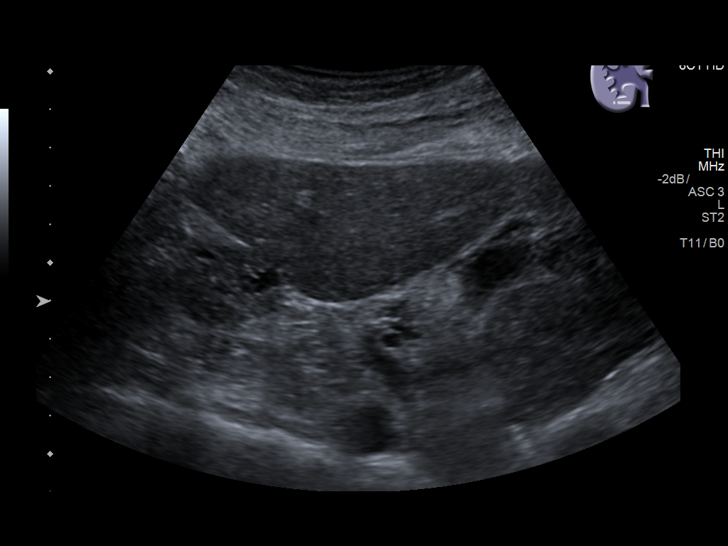
[im 25/47]
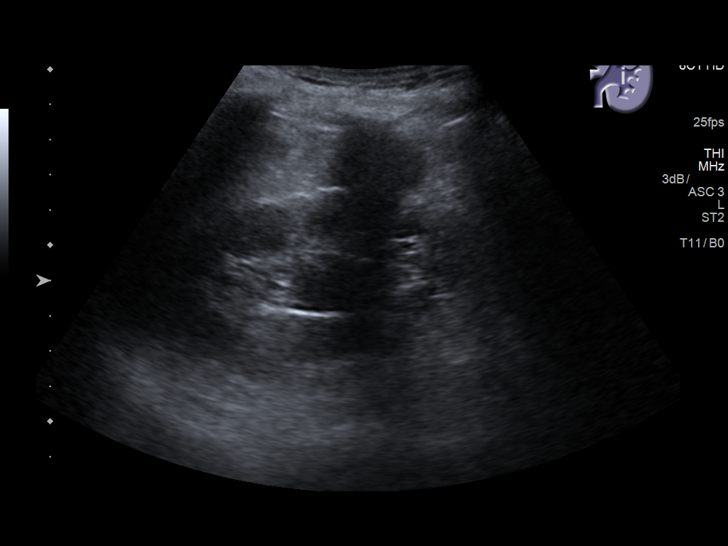
[im 29/47]
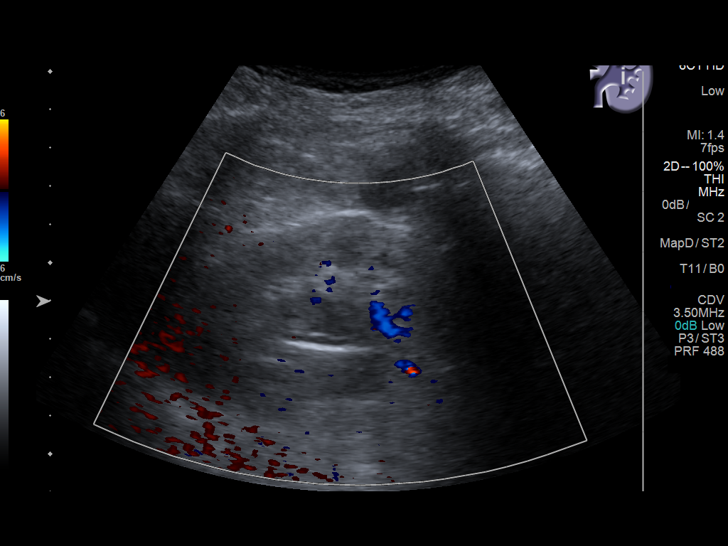
[im 31/47]
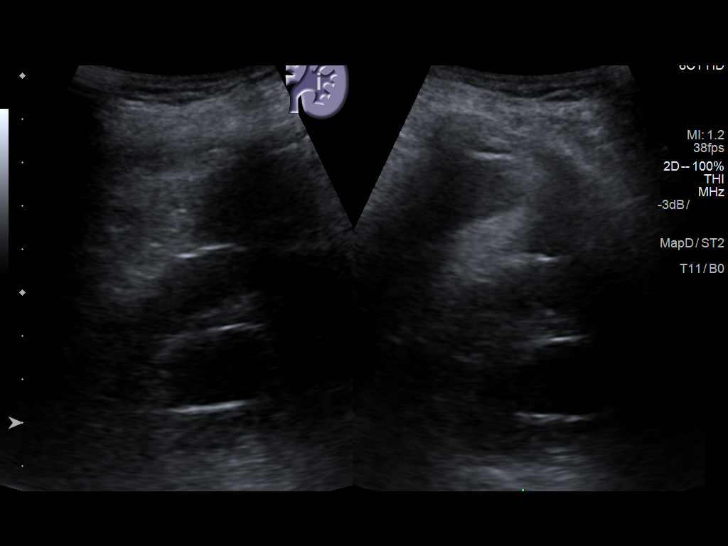
[im 35/47]
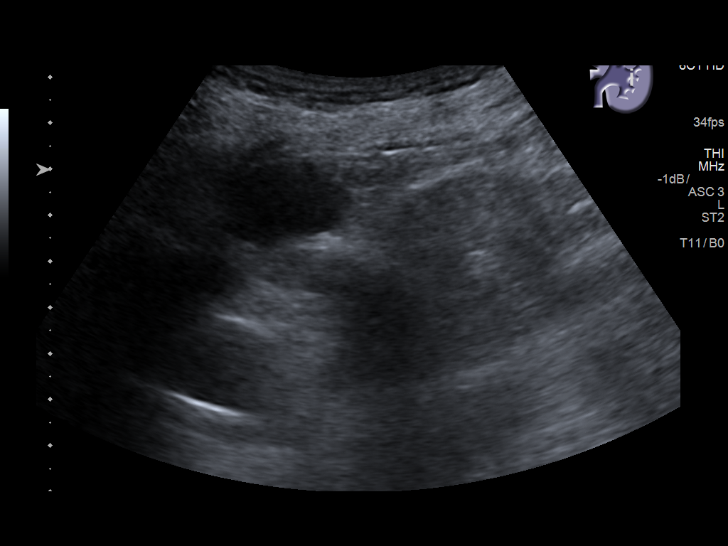
[im 39/47]
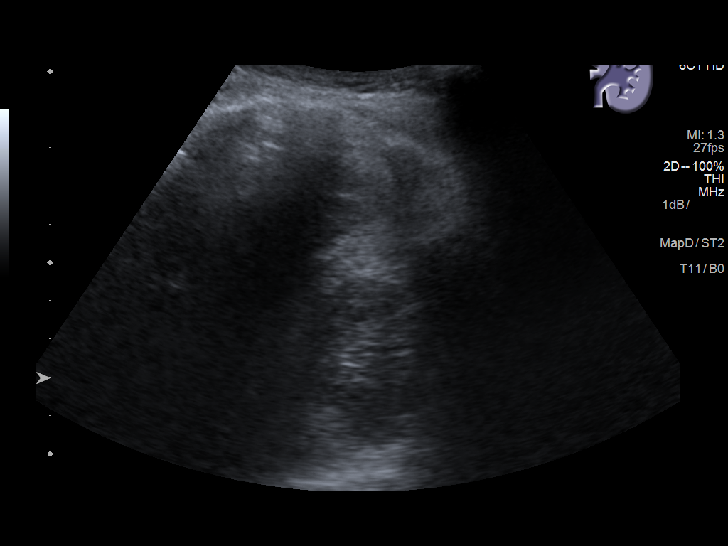
[im 43/47]
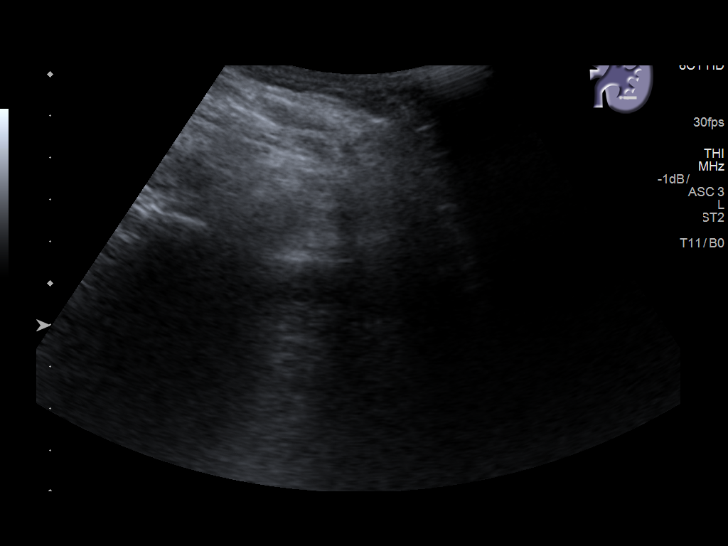
[im 47/47]
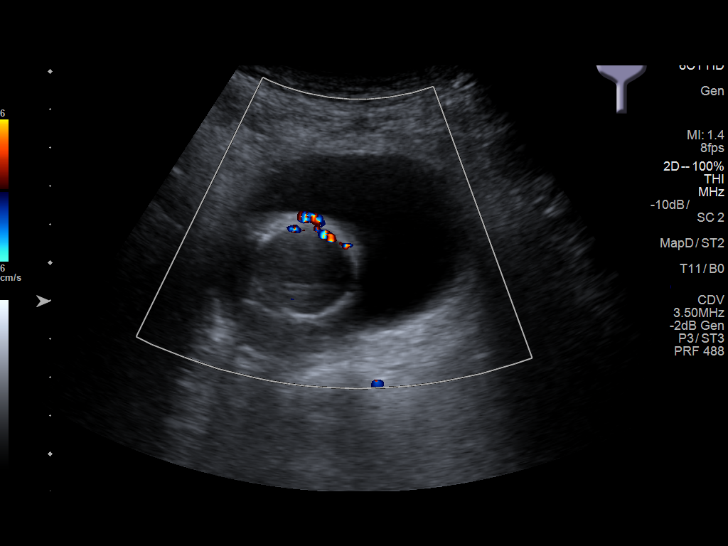

[14 of 25 positions shown; findings below may reference images not displayed]

FINDINGS: Right Kidney:

Length: 8.6 cm.  Multiple cysts with the largest measuring 6.2 cm.

Left Kidney:

Length: 9.5 cm.  Multiple cysts.  The largest measures 2.8 cm.

Bladder:

Decompressed with a Foley catheter.
IMPRESSION: Renal cysts.  No cause for acute renal failure.
